# Patient Record
Sex: Female | Born: 1945 | Race: White | Hispanic: No | Marital: Married | Smoking: Never smoker
Health system: Southern US, Community
[De-identification: ages and names within clinical notes are randomized; demographics above are authoritative.]

## PROBLEM LIST (undated history)

## (undated) DIAGNOSIS — K219 Gastro-esophageal reflux disease without esophagitis: Secondary | ICD-10-CM

## (undated) DIAGNOSIS — M4800 Spinal stenosis, site unspecified: Secondary | ICD-10-CM

## (undated) DIAGNOSIS — R55 Syncope and collapse: Secondary | ICD-10-CM

## (undated) DIAGNOSIS — I10 Essential (primary) hypertension: Secondary | ICD-10-CM

## (undated) DIAGNOSIS — N200 Calculus of kidney: Secondary | ICD-10-CM

## (undated) DIAGNOSIS — E785 Hyperlipidemia, unspecified: Secondary | ICD-10-CM

## (undated) DIAGNOSIS — M549 Dorsalgia, unspecified: Secondary | ICD-10-CM

## (undated) DIAGNOSIS — Z87448 Personal history of other diseases of urinary system: Secondary | ICD-10-CM

## (undated) DIAGNOSIS — J45909 Unspecified asthma, uncomplicated: Secondary | ICD-10-CM

## (undated) DIAGNOSIS — M1990 Unspecified osteoarthritis, unspecified site: Secondary | ICD-10-CM

## (undated) DIAGNOSIS — Z86711 Personal history of pulmonary embolism: Secondary | ICD-10-CM

## (undated) DIAGNOSIS — G8929 Other chronic pain: Secondary | ICD-10-CM

## (undated) HISTORY — DX: Dorsalgia, unspecified: M54.9

## (undated) HISTORY — DX: Calculus of kidney: N20.0

## (undated) HISTORY — PX: STOMACH SURGERY: SHX791

## (undated) HISTORY — DX: Other chronic pain: G89.29

## (undated) HISTORY — DX: Spinal stenosis, site unspecified: M48.00

## (undated) HISTORY — DX: Syncope and collapse: R55

## (undated) HISTORY — DX: Essential (primary) hypertension: I10

## (undated) HISTORY — DX: Gastro-esophageal reflux disease without esophagitis: K21.9

## (undated) HISTORY — DX: Personal history of other diseases of urinary system: Z87.448

## (undated) HISTORY — DX: Unspecified osteoarthritis, unspecified site: M19.90

## (undated) HISTORY — DX: Unspecified asthma, uncomplicated: J45.909

## (undated) HISTORY — DX: Hyperlipidemia, unspecified: E78.5

## (undated) HISTORY — DX: Personal history of pulmonary embolism: Z86.711

---

## 1988-06-26 DIAGNOSIS — Z86711 Personal history of pulmonary embolism: Secondary | ICD-10-CM

## 1988-06-26 HISTORY — DX: Personal history of pulmonary embolism: Z86.711

## 2004-08-23 ENCOUNTER — Ambulatory Visit: Payer: Self-pay | Admitting: Psychiatry

## 2005-01-27 ENCOUNTER — Ambulatory Visit: Payer: Self-pay | Admitting: Gastroenterology

## 2005-02-14 ENCOUNTER — Ambulatory Visit: Payer: Self-pay | Admitting: Gastroenterology

## 2005-03-09 ENCOUNTER — Ambulatory Visit: Payer: Self-pay | Admitting: Internal Medicine

## 2005-04-06 ENCOUNTER — Emergency Department: Payer: Self-pay | Admitting: Emergency Medicine

## 2005-04-20 ENCOUNTER — Encounter: Payer: Self-pay | Admitting: General Practice

## 2005-07-12 ENCOUNTER — Ambulatory Visit: Payer: Self-pay | Admitting: Urology

## 2005-10-11 ENCOUNTER — Ambulatory Visit: Payer: Self-pay | Admitting: Urology

## 2005-10-30 ENCOUNTER — Ambulatory Visit: Payer: Self-pay | Admitting: Urology

## 2005-12-01 ENCOUNTER — Ambulatory Visit: Payer: Self-pay | Admitting: Internal Medicine

## 2006-01-19 ENCOUNTER — Ambulatory Visit: Payer: Self-pay | Admitting: Urology

## 2006-01-24 ENCOUNTER — Inpatient Hospital Stay: Payer: Self-pay | Admitting: Internal Medicine

## 2006-03-01 ENCOUNTER — Ambulatory Visit: Payer: Self-pay | Admitting: Gastroenterology

## 2006-03-01 ENCOUNTER — Other Ambulatory Visit: Payer: Self-pay

## 2006-03-08 ENCOUNTER — Ambulatory Visit: Payer: Self-pay | Admitting: Gastroenterology

## 2006-06-20 ENCOUNTER — Ambulatory Visit: Payer: Self-pay | Admitting: Urology

## 2006-12-14 ENCOUNTER — Ambulatory Visit: Payer: Self-pay | Admitting: Urology

## 2007-05-28 ENCOUNTER — Ambulatory Visit: Payer: Self-pay | Admitting: Urology

## 2007-09-20 ENCOUNTER — Ambulatory Visit: Payer: Self-pay | Admitting: Internal Medicine

## 2008-01-07 ENCOUNTER — Ambulatory Visit: Payer: Self-pay | Admitting: Urology

## 2008-02-28 ENCOUNTER — Ambulatory Visit: Payer: Self-pay | Admitting: Urology

## 2008-04-07 ENCOUNTER — Ambulatory Visit: Payer: Self-pay | Admitting: Urology

## 2008-11-11 ENCOUNTER — Ambulatory Visit: Payer: Self-pay | Admitting: Urology

## 2009-05-07 ENCOUNTER — Other Ambulatory Visit: Payer: Self-pay | Admitting: Internal Medicine

## 2009-06-08 ENCOUNTER — Ambulatory Visit: Payer: Self-pay | Admitting: Internal Medicine

## 2009-11-30 ENCOUNTER — Ambulatory Visit: Payer: Self-pay | Admitting: Cardiovascular Disease

## 2009-11-30 ENCOUNTER — Observation Stay: Payer: Self-pay | Admitting: Internal Medicine

## 2010-04-18 ENCOUNTER — Emergency Department: Payer: Self-pay | Admitting: Emergency Medicine

## 2010-06-03 ENCOUNTER — Other Ambulatory Visit: Payer: Self-pay | Admitting: Internal Medicine

## 2010-09-12 ENCOUNTER — Ambulatory Visit: Payer: Self-pay | Admitting: Gastroenterology

## 2011-01-06 ENCOUNTER — Other Ambulatory Visit: Payer: Self-pay | Admitting: Internal Medicine

## 2012-08-15 ENCOUNTER — Other Ambulatory Visit: Payer: Self-pay | Admitting: Internal Medicine

## 2012-08-15 LAB — URINALYSIS, COMPLETE
Bilirubin,UR: NEGATIVE
Glucose,UR: NEGATIVE mg/dL (ref 0–75)
Ketone: NEGATIVE
Nitrite: NEGATIVE
Ph: 5 (ref 4.5–8.0)
Protein: NEGATIVE
Specific Gravity: 1.016 (ref 1.003–1.030)
Squamous Epithelial: 4

## 2013-01-03 ENCOUNTER — Emergency Department: Payer: Self-pay | Admitting: Emergency Medicine

## 2013-02-06 ENCOUNTER — Ambulatory Visit: Payer: Self-pay | Admitting: Cardiovascular Disease

## 2013-02-26 ENCOUNTER — Encounter: Payer: Self-pay | Admitting: Cardiovascular Disease

## 2013-02-26 ENCOUNTER — Ambulatory Visit (INDEPENDENT_AMBULATORY_CARE_PROVIDER_SITE_OTHER): Payer: Medicare Other | Admitting: Cardiovascular Disease

## 2013-02-26 VITALS — BP 156/85 | HR 101 | Ht 67.0 in | Wt 189.0 lb

## 2013-02-26 DIAGNOSIS — R42 Dizziness and giddiness: Secondary | ICD-10-CM

## 2013-02-26 DIAGNOSIS — R5381 Other malaise: Secondary | ICD-10-CM

## 2013-02-26 DIAGNOSIS — R55 Syncope and collapse: Secondary | ICD-10-CM

## 2013-02-26 DIAGNOSIS — R002 Palpitations: Secondary | ICD-10-CM

## 2013-02-26 DIAGNOSIS — I10 Essential (primary) hypertension: Secondary | ICD-10-CM

## 2013-02-26 MED ORDER — METOPROLOL TARTRATE 25 MG PO TABS: 25.0000 mg | ORAL_TABLET | Freq: Two times a day (BID) | ORAL | Status: DC

## 2013-02-26 NOTE — Assessment & Plan Note (Signed)
She is tremendously deconditioned and relies on her husband who has underlying medical issues.

## 2013-02-26 NOTE — Assessment & Plan Note (Addendum)
Is clear she is having actual syncope episodes. Does not appear to have any traumatic events. She seems to have weak spells, "blacking out" of her vision requiring assistance from her husband. No orthostatic type numbers today. She is very deconditioned at baseline. Unable to exclude neurocardiogenic issues. Heart rate is elevated and we have ordered a Holter monitor for 48 hours . If her heart rate comes back elevated, we would hold her  HCTZ as she is not taking this on a consistent basis, and we would  start her on low-dose beta blocker for heart rate control, such as metoprolol tartrate 12.5 mg twice a day.  Prior echocardiograms normal. Symptoms are not consistent with angina.  If no events on 48 hour monitor, could consider 30 day monitor.

## 2013-02-26 NOTE — Patient Instructions (Addendum)
Hold the HCTZ We will have you wear a holter for 48 hours  Wait to hear from our office If heart rate is elevated, we will start metoprolol 1/2 pill twice a day  Please call us if you have new issues that need to be addressed before your next appt.  Your physician wants you to follow-up in: 1 month.

## 2013-02-26 NOTE — Progress Notes (Signed)
Patient ID: Kim [name], female    DOB: 09-04-1945, 67 y.o.   MRN: [id number]  HPI Comments: Kim [name] is a 67 year old woman with a history of recurrent syncope, admitted to the hospital June 2011 for similar symptoms. She has a history of gastric bypass, spinal stenosis, chronic pain, neuropathy of her feet, several years of dizziness and syncope with standing, long periods of time spent in bed in the past, who presents for further evaluation in the office.  She reports that she continues to have symptoms of blacking out when she stands up. Her vision goes dark. She lives in a trailer in does not have to walk very far. She does not walk around outside her trailer very much. She is dependent on her husband for almost everything. He has underlying medical issues as well.  Previous evaluation in the hospital recommended she wear compression hose which she reports as does not help. She was discharged on low-dose beta blockers for tachycardia and possible vasovagal episodes. She is no longer on her beta blocker. She was recently started on HCTZ.he did not have followup after her Hospital admission for event monitor. She reports having Holter monitor in the past several years ago with prior cardiologist. Details are not available  She is very deconditioned at baseline.  EKG today shows sinus tachycardia with rate 101 beats per minute, rare APCs  Prior echocardiogram June she does 11 was essentially normal CT scan of the spine July 2014 after a fall with neck injury. DJD noted  Recent lab work 02/11/2013 showing creatinine 1.31, potassium 3.4, normal hematocrit, Klebsiella pneumonia in her urine    Outpatient Encounter Prescriptions as of 02/26/2013  Medication Sig Dispense Refill  . allopurinol (ZYLOPRIM) 300 MG tablet Take 300 mg by mouth daily.      . diazepam (VALIUM) 5 MG tablet Take 5 mg by mouth 4 (four) times daily - after meals and at bedtime.      Marland Kitchen HYDROcodone-acetaminophen (NORCO)  10-325 MG per tablet Take 1 tablet by mouth as needed for pain.      . potassium chloride SA (K-DUR,KLOR-CON) 20 MEQ tablet Take 20 mEq by mouth daily.      . ranitidine (ZANTAC) 150 MG tablet Take 150 mg by mouth 4 (four) times daily - after meals and at bedtime.      . [DISCONTINUED] hydrochlorothiazide (HYDRODIURIL) 25 MG tablet Take 25 mg by mouth daily.      . metoprolol tartrate (LOPRESSOR) 25 MG tablet Take 1 tablet (25 mg total) by mouth 2 (two) times daily.  60 tablet  3   No facility-administered encounter medications on file as of 02/26/2013.     Review of Systems  HENT: Negative.   Eyes: Negative.   Respiratory: Negative.   Cardiovascular: Negative.   Gastrointestinal: Negative.   Musculoskeletal: Positive for gait problem.  Skin: Negative.   Neurological: Positive for dizziness, syncope and weakness.       "Black out spells"  Psychiatric/Behavioral: Negative.   All other systems reviewed and are negative.   BP 156/85  Pulse 101  Ht 5\' 7"  (1.702 m)  Wt 189 lb (85.73 kg)  BMI 29.59 kg/m2 Repeat blood pressures 138/78, 135/91, heart rate in the high 90s Physical Exam  Nursing note and vitals reviewed. Constitutional: She is oriented to person, place, and time. She appears well-developed and well-nourished.  HENT:  Head: Normocephalic.  Nose: Nose normal.  Mouth/Throat: Oropharynx is clear and moist.  Eyes: Conjunctivae are normal.  Pupils are equal, round, and reactive to light.  Neck: Normal range of motion. Neck supple. No JVD present.  Cardiovascular: Normal rate, regular rhythm, S1 normal, S2 normal, normal heart sounds and intact distal pulses.  Exam reveals no gallop and no friction rub.   No murmur heard. Pulmonary/Chest: Effort normal and breath sounds normal. No respiratory distress. She has no wheezes. She has no rales. She exhibits no tenderness.  Abdominal: Soft. Bowel sounds are normal. She exhibits no distension. There is no tenderness.   Musculoskeletal: Normal range of motion. She exhibits no edema and no tenderness.  Lymphadenopathy:    She has no cervical adenopathy.  Neurological: She is alert and oriented to person, place, and time. Coordination normal.  Skin: Skin is warm and dry. No rash noted. No erythema.  Psychiatric: She has a normal mood and affect. Her behavior is normal. Judgment and thought content normal.    Assessment and Plan

## 2013-02-26 NOTE — Assessment & Plan Note (Signed)
She's not taking her HCTZ. She is afraid of her low potassium. We will consider changing her to low-dose metoprolol tartrate given her tachycardia to be started after her Holter monitor

## 2013-02-28 ENCOUNTER — Telehealth: Payer: Self-pay | Admitting: *Deleted

## 2013-02-28 NOTE — Telephone Encounter (Signed)
Patient forgot to share some information. Patient fell and hit her head hard. She went to ED.

## 2013-02-28 NOTE — Telephone Encounter (Signed)
Spoke w/ pt.  She stated that she fell and hit her head in July and was diagnosed with a contusion, but not a concussion. Orthopedic doctor did x-ray today and found that her left arm is broken, as well, so she is now wearing a cast on both arms.  They also looked at her head x-ray and told her that she definitely had a concussion. She reports that she does not recall her dizziness being as bad before the fall and reports more falls since then. She wanted to let us know that she is terrified to get out of her bed and is not doing much while she is wearing her holter.   She is wondering now if her problem is her head and not her heart.

## 2013-03-17 ENCOUNTER — Telehealth: Payer: Self-pay

## 2013-03-17 ENCOUNTER — Encounter: Payer: Self-pay | Admitting: Cardiovascular Disease

## 2013-03-17 ENCOUNTER — Ambulatory Visit (INDEPENDENT_AMBULATORY_CARE_PROVIDER_SITE_OTHER): Payer: Medicare Other

## 2013-03-17 DIAGNOSIS — R55 Syncope and collapse: Secondary | ICD-10-CM

## 2013-03-17 NOTE — Telephone Encounter (Signed)
Spoke w/ pt.  She states that she experienced chest "discomfort" this weekend during the night, but it went away. Very lengthy conversation about spouse buying her metoprolol and not giving it to her, so she has not taken any of it.  She will start metoprolol 25 mg 1/2 pill bid for 1 week and call us back to let us know how she is doing.

## 2013-03-21 ENCOUNTER — Telehealth: Payer: Self-pay | Admitting: *Deleted

## 2013-03-21 NOTE — Telephone Encounter (Signed)
Pt was called with Holter results and added more symptoms. C/o low K+ and is now on kdur x 1 month C/o episodes of CP lasting 2 hrs, mid chest, pain 7/10, pain is sharp, caused HA, nausea, sweaty, she did not go to hospital. Pt had been laying in bed still/ awake when episode started. bp 178/103 p 78 151/93 p76 166/94 p 71  Please advise

## 2013-03-21 NOTE — Telephone Encounter (Signed)
Message copied by Antony Odea on Fri Mar 21, 2013  1:48 PM ------      Message from: Antonieta Iba      Created: Fri Mar 21, 2013  7:49 AM       Holter monitor looked good, only small number of extra beats      No cause of her black out or pass out spells ------

## 2013-03-31 ENCOUNTER — Encounter: Payer: Self-pay | Admitting: Cardiovascular Disease

## 2013-03-31 ENCOUNTER — Ambulatory Visit: Payer: Medicare Other | Admitting: Cardiovascular Disease

## 2013-03-31 ENCOUNTER — Ambulatory Visit (INDEPENDENT_AMBULATORY_CARE_PROVIDER_SITE_OTHER): Payer: Medicare Other | Admitting: Cardiovascular Disease

## 2013-03-31 VITALS — BP 174/100 | HR 102 | Ht 67.0 in | Wt 192.0 lb

## 2013-03-31 DIAGNOSIS — R5381 Other malaise: Secondary | ICD-10-CM

## 2013-03-31 DIAGNOSIS — R0602 Shortness of breath: Secondary | ICD-10-CM

## 2013-03-31 DIAGNOSIS — R55 Syncope and collapse: Secondary | ICD-10-CM

## 2013-03-31 DIAGNOSIS — R079 Chest pain, unspecified: Secondary | ICD-10-CM

## 2013-03-31 DIAGNOSIS — R42 Dizziness and giddiness: Secondary | ICD-10-CM

## 2013-03-31 DIAGNOSIS — I10 Essential (primary) hypertension: Secondary | ICD-10-CM

## 2013-03-31 MED ORDER — VERAPAMIL HCL ER 120 MG PO TBCR: 120.0000 mg | EXTENDED_RELEASE_TABLET | Freq: Every day | ORAL | Status: AC

## 2013-03-31 NOTE — Progress Notes (Signed)
Patient ID: Kim [name], female    DOB: 03/07/1946, 67 y.o.   MRN: [id number]  HPI Comments: [name] is a 67 year old woman with a history of recurrent syncope, admitted to the hospital June 2011 for similar symptoms, history of gastric bypass, spinal stenosis, chronic pain, neuropathy of her feet, several years of dizziness and syncope with standing, long periods of time spent in bed in the past, who presents for further evaluation in the office. She has been dependent on her husband for much of her care at home. She has multiple orthopedic complaints.  She reports today having significant stress at home. She and her husband aren't not getting along well. He is not helping her with her ADLs. She continues to have periods of tachycardia, pulse reports having severe hypertension when she checks her blood pressure at home. She does not walk around outside her trailer very much. She is dependent on her husband for almost everything. She is very deconditioned at baseline.  EKG today shows sinus tachycardia with rate 102 beats per minute.  Prior echocardiogram June she does 11 was essentially normal CT scan of the spine July 2014 after a fall with neck injury. DJD noted  Recent lab work 02/11/2013 showing creatinine 1.31, potassium 3.4, normal hematocrit, Klebsiella pneumonia in her urine    Outpatient Encounter Prescriptions as of 03/31/2013  Medication Sig Dispense Refill  . allopurinol (ZYLOPRIM) 300 MG tablet Take 300 mg by mouth daily.      . diazepam (VALIUM) 5 MG tablet Take 5 mg by mouth every 6 (six) hours as needed.       Marland Kitchen HYDROcodone-acetaminophen (NORCO) 10-325 MG per tablet Take 1 tablet by mouth every 6 (six) hours as needed for pain.       . potassium chloride SA (K-DUR,KLOR-CON) 20 MEQ tablet Take 20 mEq by mouth daily.      . ranitidine (ZANTAC) 150 MG tablet Take 150 mg by mouth 2 (two) times daily.       . metoprolol tartrate (LOPRESSOR) 25 MG tablet Take 1 tablet (25 mg  total) by mouth 2 (two) times daily.  60 tablet  3  . [DISCONTINUED] allopurinol (ZYLOPRIM) 300 MG tablet Take 300 mg by mouth daily.       No facility-administered encounter medications on file as of 03/31/2013.     Review of Systems  HENT: Negative.   Eyes: Negative.   Respiratory: Negative.   Cardiovascular: Positive for palpitations.  Gastrointestinal: Negative.   Musculoskeletal: Positive for gait problem.  Skin: Negative.   Neurological: Positive for dizziness.  Psychiatric/Behavioral: Negative.   All other systems reviewed and are negative.   BP 174/100  Pulse 102  Ht 5\' 7"  (1.702 m)  Wt 192 lb (87.091 kg)  BMI 30.06 kg/m2  Physical Exam  Nursing note and vitals reviewed. Constitutional: She is oriented to person, place, and time. She appears well-developed and well-nourished.  HENT:  Head: Normocephalic.  Nose: Nose normal.  Mouth/Throat: Oropharynx is clear and moist.  Eyes: Conjunctivae are normal. Pupils are equal, round, and reactive to light.  Neck: Normal range of motion. Neck supple. No JVD present.  Cardiovascular: Normal rate, regular rhythm, S1 normal, S2 normal, normal heart sounds and intact distal pulses.  Exam reveals no gallop and no friction rub.   No murmur heard. Pulmonary/Chest: Effort normal and breath sounds normal. No respiratory distress. She has no wheezes. She has no rales. She exhibits no tenderness.  Abdominal: Soft. Bowel sounds are normal.  She exhibits no distension. There is no tenderness.  Musculoskeletal: Normal range of motion. She exhibits no edema and no tenderness.  Lymphadenopathy:    She has no cervical adenopathy.  Neurological: She is alert and oriented to person, place, and time. Coordination normal.  Skin: Skin is warm and dry. No rash noted. No erythema.  Psychiatric: She has a normal mood and affect. Her behavior is normal. Judgment and thought content normal.    Assessment and Plan

## 2013-03-31 NOTE — Assessment & Plan Note (Signed)
Significant hypertension today. We will restart her on verapamil 120 mg daily as she reports being on this medication in the past. Hopefully this will also help her palpitations. We have asked her to closely monitor her blood pressure

## 2013-03-31 NOTE — Assessment & Plan Note (Signed)
Encouraged her to start a walking program. She appears to be moving around the clinic exam room relatively well

## 2013-03-31 NOTE — Patient Instructions (Addendum)
You are doing well. Please start verapamil 120 mg daily  Please call us if you have new issues that need to be addressed before your next appt.  Your physician wants you to follow-up in: 6 months.  You will receive a reminder letter in the mail two months in advance. If you don't receive a letter, please call our office to schedule the follow-up appointment.

## 2013-03-31 NOTE — Assessment & Plan Note (Signed)
No recent episodes of syncope. Etiology of these episodes is unclear.

## 2013-03-31 NOTE — Assessment & Plan Note (Signed)
One episode of dizziness his past weekend in the setting of severe hypertension. We'll work on her blood pressure

## 2013-04-03 ENCOUNTER — Telehealth: Payer: Self-pay | Admitting: *Deleted

## 2013-04-03 NOTE — Telephone Encounter (Signed)
Please call patient still having chest pain and has medication questions. Thanks!

## 2013-04-08 ENCOUNTER — Telehealth: Payer: Self-pay | Admitting: *Deleted

## 2013-04-08 NOTE — Telephone Encounter (Signed)
Patient still having chest pains please advise

## 2013-05-07 ENCOUNTER — Ambulatory Visit: Payer: Self-pay | Admitting: Internal Medicine

## 2013-05-15 ENCOUNTER — Ambulatory Visit: Payer: Self-pay | Admitting: Internal Medicine

## 2013-05-26 ENCOUNTER — Ambulatory Visit: Payer: Self-pay | Admitting: Gastroenterology

## 2013-07-14 ENCOUNTER — Other Ambulatory Visit: Payer: Self-pay

## 2013-07-14 MED ORDER — METOPROLOL TARTRATE 25 MG PO TABS: 25.0000 mg | ORAL_TABLET | Freq: Two times a day (BID) | ORAL | Status: AC

## 2013-07-14 NOTE — Telephone Encounter (Signed)
Refill sent for metoprolol tart 25 mg twice a day

## 2013-08-05 ENCOUNTER — Ambulatory Visit: Payer: Medicare Other | Admitting: Cardiovascular Disease

## 2013-10-01 ENCOUNTER — Ambulatory Visit: Payer: Self-pay | Admitting: Gastroenterology

## 2013-10-07 ENCOUNTER — Emergency Department: Payer: Self-pay | Admitting: Emergency Medicine

## 2013-10-07 LAB — COMPREHENSIVE METABOLIC PANEL
ALBUMIN: 3.4 g/dL (ref 3.4–5.0)
ALK PHOS: 97 U/L
ALT: 13 U/L (ref 12–78)
Anion Gap: 4 — ABNORMAL LOW (ref 7–16)
BILIRUBIN TOTAL: 0.5 mg/dL (ref 0.2–1.0)
BUN: 11 mg/dL (ref 7–18)
CHLORIDE: 99 mmol/L (ref 98–107)
CO2: 34 mmol/L — AB (ref 21–32)
Calcium, Total: 8.9 mg/dL (ref 8.5–10.1)
Creatinine: 1.4 mg/dL — ABNORMAL HIGH (ref 0.60–1.30)
EGFR (Non-African Amer.): 39 — ABNORMAL LOW
GFR CALC AF AMER: 46 — AB
Glucose: 105 mg/dL — ABNORMAL HIGH (ref 65–99)
Osmolality: 274 (ref 275–301)
Potassium: 3.4 mmol/L — ABNORMAL LOW (ref 3.5–5.1)
SGOT(AST): 14 U/L — ABNORMAL LOW (ref 15–37)
Sodium: 137 mmol/L (ref 136–145)
Total Protein: 7.2 g/dL (ref 6.4–8.2)

## 2013-10-07 LAB — CBC
HCT: 38.5 % (ref 35.0–47.0)
HGB: 11.9 g/dL — ABNORMAL LOW (ref 12.0–16.0)
MCH: 29.3 pg (ref 26.0–34.0)
MCHC: 31 g/dL — AB (ref 32.0–36.0)
MCV: 95 fL (ref 80–100)
Platelet: 246 10*3/uL (ref 150–440)
RBC: 4.06 10*6/uL (ref 3.80–5.20)
RDW: 14.4 % (ref 11.5–14.5)
WBC: 7.9 10*3/uL (ref 3.6–11.0)

## 2013-10-11 ENCOUNTER — Emergency Department: Payer: Self-pay | Admitting: Emergency Medicine

## 2013-10-15 ENCOUNTER — Ambulatory Visit: Payer: Self-pay | Admitting: Orthopedic Surgery

## 2013-10-16 ENCOUNTER — Ambulatory Visit: Payer: Self-pay | Admitting: Orthopedic Surgery

## 2013-10-17 ENCOUNTER — Encounter: Payer: Self-pay | Admitting: Internal Medicine

## 2013-10-22 ENCOUNTER — Ambulatory Visit: Payer: Medicare Other | Admitting: Cardiovascular Disease

## 2013-10-24 ENCOUNTER — Encounter: Payer: Self-pay | Admitting: Internal Medicine

## 2013-10-24 LAB — BASIC METABOLIC PANEL
ANION GAP: 5 — AB (ref 7–16)
BUN: 11 mg/dL (ref 7–18)
CALCIUM: 9 mg/dL (ref 8.5–10.1)
Chloride: 104 mmol/L (ref 98–107)
Co2: 31 mmol/L (ref 21–32)
Creatinine: 1.11 mg/dL (ref 0.60–1.30)
EGFR (African American): >60
GFR CALC NON AF AMER: 52 — AB
GLUCOSE: 101 mg/dL — AB (ref 65–99)
OSMOLALITY: 279 (ref 275–301)
Potassium: 3.7 mmol/L (ref 3.5–5.1)
Sodium: 140 mmol/L (ref 136–145)

## 2013-10-24 LAB — CBC WITH DIFFERENTIAL/PLATELET
BASOS ABS: 0 10*3/uL (ref 0.0–0.1)
Basophil %: 0.7 %
Eosinophil #: 0.2 10*3/uL (ref 0.0–0.7)
Eosinophil %: 4 %
HCT: 35.9 % (ref 35.0–47.0)
HGB: 11.9 g/dL — ABNORMAL LOW (ref 12.0–16.0)
LYMPHS PCT: 42.4 %
Lymphocyte #: 2.6 10*3/uL (ref 1.0–3.6)
MCH: 31 pg (ref 26.0–34.0)
MCHC: 33.1 g/dL (ref 32.0–36.0)
MCV: 94 fL (ref 80–100)
MONO ABS: 0.4 x10 3/mm (ref 0.2–0.9)
MONOS PCT: 7 %
Neutrophil #: 2.8 10*3/uL (ref 1.4–6.5)
Neutrophil %: 45.9 %
PLATELETS: 408 10*3/uL (ref 150–440)
RBC: 3.82 10*6/uL (ref 3.80–5.20)
RDW: 13.9 % (ref 11.5–14.5)
WBC: 6.2 10*3/uL (ref 3.6–11.0)

## 2013-10-24 LAB — TSH: Thyroid Stimulating Horm: 0.546 u[IU]/mL

## 2013-10-24 LAB — MAGNESIUM: Magnesium: 2 mg/dL

## 2013-11-02 LAB — TROPONIN I: Troponin-I: <0.02 ng/mL

## 2013-11-02 LAB — COMPREHENSIVE METABOLIC PANEL
ALT: 13 U/L (ref 12–78)
Albumin: 3.6 g/dL (ref 3.4–5.0)
Alkaline Phosphatase: 117 U/L
Anion Gap: 8 (ref 7–16)
BUN: 13 mg/dL (ref 7–18)
Bilirubin,Total: 0.5 mg/dL (ref 0.2–1.0)
CALCIUM: 8.8 mg/dL (ref 8.5–10.1)
CO2: 28 mmol/L (ref 21–32)
Chloride: 101 mmol/L (ref 98–107)
Creatinine: 1.53 mg/dL — ABNORMAL HIGH (ref 0.60–1.30)
EGFR (African American): 41 — ABNORMAL LOW
EGFR (Non-African Amer.): 35 — ABNORMAL LOW
GLUCOSE: 94 mg/dL (ref 65–99)
Osmolality: 274 (ref 275–301)
Potassium: 3.5 mmol/L (ref 3.5–5.1)
SGOT(AST): 42 U/L — ABNORMAL HIGH (ref 15–37)
Sodium: 137 mmol/L (ref 136–145)
Total Protein: 7.7 g/dL (ref 6.4–8.2)

## 2013-11-02 LAB — PROTIME-INR
INR: 1
PROTHROMBIN TIME: 12.7 s (ref 11.5–14.7)

## 2013-11-02 LAB — CBC
HCT: 38.1 % (ref 35.0–47.0)
HGB: 12.3 g/dL (ref 12.0–16.0)
MCH: 30.5 pg (ref 26.0–34.0)
MCHC: 32.4 g/dL (ref 32.0–36.0)
MCV: 94 fL (ref 80–100)
PLATELETS: 302 10*3/uL (ref 150–440)
RBC: 4.05 10*6/uL (ref 3.80–5.20)
RDW: 13.7 % (ref 11.5–14.5)
WBC: 9 10*3/uL (ref 3.6–11.0)

## 2013-11-03 ENCOUNTER — Inpatient Hospital Stay: Payer: Self-pay | Admitting: Internal Medicine

## 2013-11-03 LAB — DRUG SCREEN, URINE
Amphetamines, Ur Screen: NEGATIVE (ref ?–1000)
BENZODIAZEPINE, UR SCRN: POSITIVE (ref ?–200)
Barbiturates, Ur Screen: NEGATIVE (ref ?–200)
Cannabinoid 50 Ng, Ur ~~LOC~~: NEGATIVE (ref ?–50)
Cocaine Metabolite,Ur ~~LOC~~: NEGATIVE (ref ?–300)
MDMA (ECSTASY) UR SCREEN: NEGATIVE (ref ?–500)
METHADONE, UR SCREEN: NEGATIVE (ref ?–300)
OPIATE, UR SCREEN: POSITIVE (ref ?–300)
PHENCYCLIDINE (PCP) UR S: NEGATIVE (ref ?–25)
TRICYCLIC, UR SCREEN: NEGATIVE (ref ?–1000)

## 2013-11-03 LAB — URINALYSIS, COMPLETE
Bilirubin,UR: NEGATIVE
Glucose,UR: NEGATIVE mg/dL (ref 0–75)
Ketone: NEGATIVE
NITRITE: NEGATIVE
PROTEIN: NEGATIVE
Ph: 6 (ref 4.5–8.0)
SPECIFIC GRAVITY: 1.004 (ref 1.003–1.030)
WBC UR: 265 /HPF (ref 0–5)

## 2013-11-04 LAB — CBC WITH DIFFERENTIAL/PLATELET
BASOS ABS: 0 10*3/uL (ref 0.0–0.1)
Basophil %: 0.7 %
EOS PCT: 6.1 %
Eosinophil #: 0.4 10*3/uL (ref 0.0–0.7)
HCT: 36.4 % (ref 35.0–47.0)
HGB: 11.8 g/dL — ABNORMAL LOW (ref 12.0–16.0)
Lymphocyte #: 2.5 10*3/uL (ref 1.0–3.6)
Lymphocyte %: 38.7 %
MCH: 30.6 pg (ref 26.0–34.0)
MCHC: 32.5 g/dL (ref 32.0–36.0)
MCV: 94 fL (ref 80–100)
MONOS PCT: 7.6 %
Monocyte #: 0.5 x10 3/mm (ref 0.2–0.9)
Neutrophil #: 3 10*3/uL (ref 1.4–6.5)
Neutrophil %: 46.9 %
PLATELETS: 248 10*3/uL (ref 150–440)
RBC: 3.87 10*6/uL (ref 3.80–5.20)
RDW: 14 % (ref 11.5–14.5)
WBC: 6.5 10*3/uL (ref 3.6–11.0)

## 2013-11-04 LAB — BASIC METABOLIC PANEL
Anion Gap: 5 — ABNORMAL LOW (ref 7–16)
BUN: 10 mg/dL (ref 7–18)
CO2: 29 mmol/L (ref 21–32)
Calcium, Total: 8.8 mg/dL (ref 8.5–10.1)
Chloride: 109 mmol/L — ABNORMAL HIGH (ref 98–107)
Creatinine: 1.33 mg/dL — ABNORMAL HIGH (ref 0.60–1.30)
EGFR (African American): 48 — ABNORMAL LOW
EGFR (Non-African Amer.): 42 — ABNORMAL LOW
Glucose: 104 mg/dL — ABNORMAL HIGH (ref 65–99)
Osmolality: 284 (ref 275–301)
POTASSIUM: 3.6 mmol/L (ref 3.5–5.1)
SODIUM: 143 mmol/L (ref 136–145)

## 2013-11-05 LAB — URINE CULTURE

## 2013-11-06 LAB — AMMONIA: Ammonia, Plasma: 14 mcmol/L (ref 11–32)

## 2013-11-07 LAB — VALPROIC ACID LEVEL: Valproic Acid: 78 ug/mL

## 2013-11-08 LAB — CREATININE, SERUM
CREATININE: 1.6 mg/dL — AB (ref 0.60–1.30)
EGFR (African American): 39 — ABNORMAL LOW
EGFR (Non-African Amer.): 33 — ABNORMAL LOW

## 2013-11-09 LAB — CREATININE, SERUM
Creatinine: 1.69 mg/dL — ABNORMAL HIGH (ref 0.60–1.30)
EGFR (African American): 36 — ABNORMAL LOW
EGFR (Non-African Amer.): 31 — ABNORMAL LOW

## 2013-11-10 LAB — CREATININE, SERUM
Creatinine: 1.5 mg/dL — ABNORMAL HIGH (ref 0.60–1.30)
EGFR (African American): 42 — ABNORMAL LOW
EGFR (Non-African Amer.): 36 — ABNORMAL LOW

## 2013-12-02 ENCOUNTER — Emergency Department: Payer: Self-pay | Admitting: Emergency Medicine

## 2013-12-02 LAB — DRUG SCREEN, URINE
AMPHETAMINES, UR SCREEN: NEGATIVE (ref ?–1000)
Barbiturates, Ur Screen: NEGATIVE (ref ?–200)
Benzodiazepine, Ur Scrn: POSITIVE (ref ?–200)
Cannabinoid 50 Ng, Ur ~~LOC~~: NEGATIVE (ref ?–50)
Cocaine Metabolite,Ur ~~LOC~~: NEGATIVE (ref ?–300)
MDMA (Ecstasy)Ur Screen: NEGATIVE (ref ?–500)
Methadone, Ur Screen: NEGATIVE (ref ?–300)
OPIATE, UR SCREEN: POSITIVE (ref ?–300)
Phencyclidine (PCP) Ur S: NEGATIVE (ref ?–25)
Tricyclic, Ur Screen: POSITIVE (ref ?–1000)

## 2013-12-02 LAB — COMPREHENSIVE METABOLIC PANEL
ALBUMIN: 2.7 g/dL — AB (ref 3.4–5.0)
ALT: 11 U/L — AB (ref 12–78)
Alkaline Phosphatase: 149 U/L — ABNORMAL HIGH
Anion Gap: 7 (ref 7–16)
BILIRUBIN TOTAL: 0.5 mg/dL (ref 0.2–1.0)
BUN: 19 mg/dL — ABNORMAL HIGH (ref 7–18)
CO2: 27 mmol/L (ref 21–32)
CREATININE: 1.44 mg/dL — AB (ref 0.60–1.30)
Calcium, Total: 8.5 mg/dL (ref 8.5–10.1)
Chloride: 107 mmol/L (ref 98–107)
EGFR (Non-African Amer.): 38 — ABNORMAL LOW
GFR CALC AF AMER: 44 — AB
Glucose: 98 mg/dL (ref 65–99)
Osmolality: 283 (ref 275–301)
Potassium: 3.8 mmol/L (ref 3.5–5.1)
SGOT(AST): 21 U/L (ref 15–37)
Sodium: 141 mmol/L (ref 136–145)
Total Protein: 6.3 g/dL — ABNORMAL LOW (ref 6.4–8.2)

## 2013-12-02 LAB — URINALYSIS, COMPLETE
BILIRUBIN, UR: NEGATIVE
BLOOD: NEGATIVE
Bacteria: NONE SEEN
Glucose,UR: NEGATIVE mg/dL (ref 0–75)
Hyaline Cast: 7
Nitrite: NEGATIVE
PROTEIN: NEGATIVE
Ph: 5 (ref 4.5–8.0)
RBC,UR: 12 /HPF (ref 0–5)
SPECIFIC GRAVITY: 1.02 (ref 1.003–1.030)
Squamous Epithelial: 11
WBC UR: 22 /HPF (ref 0–5)

## 2013-12-02 LAB — CBC
HCT: 30.5 % — ABNORMAL LOW (ref 35.0–47.0)
HGB: 9.8 g/dL — AB (ref 12.0–16.0)
MCH: 30.3 pg (ref 26.0–34.0)
MCHC: 32.1 g/dL (ref 32.0–36.0)
MCV: 94 fL (ref 80–100)
Platelet: 225 10*3/uL (ref 150–440)
RBC: 3.23 10*6/uL — ABNORMAL LOW (ref 3.80–5.20)
RDW: 13.7 % (ref 11.5–14.5)
WBC: 6.4 10*3/uL (ref 3.6–11.0)

## 2013-12-02 LAB — TSH: THYROID STIMULATING HORM: 0.825 u[IU]/mL

## 2013-12-02 LAB — ETHANOL
Ethanol %: <0.003 % (ref 0.000–0.080)
Ethanol: <3 mg/dL

## 2013-12-02 LAB — SALICYLATE LEVEL: Salicylates, Serum: <1.7 mg/dL

## 2013-12-02 LAB — VALPROIC ACID LEVEL

## 2013-12-02 LAB — ACETAMINOPHEN LEVEL: Acetaminophen: 4 ug/mL — ABNORMAL LOW

## 2013-12-03 LAB — URINE CULTURE

## 2013-12-09 LAB — CARBAMAZEPINE LEVEL, TOTAL: CARBAMAZEPINE: 11.1 ug/mL (ref 4.0–12.0)

## 2013-12-14 ENCOUNTER — Other Ambulatory Visit: Payer: Self-pay | Admitting: Family Medicine

## 2013-12-14 LAB — COMPREHENSIVE METABOLIC PANEL
AST: 33 U/L (ref 15–37)
Albumin: 2.8 g/dL — ABNORMAL LOW (ref 3.4–5.0)
Alkaline Phosphatase: 162 U/L — ABNORMAL HIGH
Anion Gap: 8 (ref 7–16)
BUN: 15 mg/dL (ref 7–18)
Bilirubin,Total: 0.3 mg/dL (ref 0.2–1.0)
CHLORIDE: 104 mmol/L (ref 98–107)
CO2: 30 mmol/L (ref 21–32)
Calcium, Total: 8.7 mg/dL (ref 8.5–10.1)
Creatinine: 1.36 mg/dL — ABNORMAL HIGH (ref 0.60–1.30)
GFR CALC AF AMER: 47 — AB
GFR CALC NON AF AMER: 41 — AB
GLUCOSE: 98 mg/dL (ref 65–99)
OSMOLALITY: 284 (ref 275–301)
Potassium: 3.3 mmol/L — ABNORMAL LOW (ref 3.5–5.1)
SGPT (ALT): 16 U/L (ref 12–78)
Sodium: 142 mmol/L (ref 136–145)
TOTAL PROTEIN: 5.9 g/dL — AB (ref 6.4–8.2)

## 2013-12-14 LAB — CBC WITH DIFFERENTIAL/PLATELET
BASOS ABS: 0.1 10*3/uL (ref 0.0–0.1)
BASOS PCT: 1.7 %
Eosinophil #: 0.6 10*3/uL (ref 0.0–0.7)
Eosinophil %: 9.3 %
HCT: 28.4 % — AB (ref 35.0–47.0)
HGB: 9.5 g/dL — ABNORMAL LOW (ref 12.0–16.0)
Lymphocyte #: 2.5 10*3/uL (ref 1.0–3.6)
Lymphocyte %: 42.5 %
MCH: 31.8 pg (ref 26.0–34.0)
MCHC: 33.5 g/dL (ref 32.0–36.0)
MCV: 95 fL (ref 80–100)
Monocyte #: 0.6 x10 3/mm (ref 0.2–0.9)
Monocyte %: 9.6 %
Neutrophil #: 2.2 10*3/uL (ref 1.4–6.5)
Neutrophil %: 36.9 %
Platelet: 282 10*3/uL (ref 150–440)
RBC: 2.99 10*6/uL — AB (ref 3.80–5.20)
RDW: 14.3 % (ref 11.5–14.5)
WBC: 6 10*3/uL (ref 3.6–11.0)

## 2014-10-17 NOTE — Discharge Summary (Signed)
PATIENT NAME:  Kim AlfSCHOOLS, Kim [name] MR#:  [id number] DATE OF BIRTH:  05/23/1946  DATE OF ADMISSION:  10/16/2013 DATE OF DISCHARGE:  10/17/2013  ADMITTING DIAGNOSIS: Comminuted displaced right distal radius fracture.   DISCHARGE DIAGNOSIS: Comminuted displaced right distal radius fracture.   PROCEDURE: ORIF right distal radius.   ANESTHESIA: General.   SURGEON: Leitha SchullerMichael J. Menz, MD.   COMPLICATIONS: There were no complications.   ESTIMATED BLOOD LOSS: Minimal.   IMPLANTS: [employer] short standard DVR plate with multiple screws and pegs.   HISTORY: The patient is a 69 year old female who is right hand dominant who fell on Saturday, 10/11/2013, and suffered a severely comminuted displaced right distal radius fracture. She had closed reduction in the ER with improved alignment, but ultimately she needed to be set up for right distal radius ORIF. She lives at home and needs some assistance with her husband. She is right hand dominant. The patient's pain has been moderate to severe. She has been taking Norco 5/325 one to two tablets every 4 to 6 hours as needed for pain. She is allergic to oxycodone. She denies any numbness or tingling in the right upper extremity. Swelling is improved since date of injury, on the 18th. She denies any elbow or shoulder discomfort.   PHYSICAL EXAMINATION: GENERAL: Well-developed, well-nourished female in no apparent distress. Normal affect. Presents in a wheelchair.  HEENT: Head normocephalic, atraumatic. Pupils equal, round, and reactive to light. She wears a soft collar around the cervical C-spine for degenerative disk disease.  HEART: Regular rate and rhythm.  LUNGS: Clear to auscultation bilaterally. No wheezing, rales or rhonchi.  RIGHT WRIST: The patient is in a sugar tong splint. The patient's fingers are moderately swollen with mild ecchymosis. She appears to have no skin breakdown noted. She has 2+ cap refill. Splint was partially removed and she  had 2+ radial pulse. Sensation was intact.   HOSPITAL COURSE: The patient was admitted to the hospital on October 16, 2013. She had surgery that same day and was brought to the orthopedic floor from the PACU in stable condition. On postop day 1, the patient was evaluated by physical therapy and it was found that she was having difficulty with activities of daily living and it would be best if she was able to be discharged to a rehab facility. The patient's vital signs were stable at discharge. Pain was controlled.  DISCHARGE INSTRUCTIONS: The patient should elevate the effected hand or arm on 1 or 2 pillows with the hand higher than the elbow. She may resume a regular diet as tolerated. Apply an ice pack to the effected area. Do not get the dressing or bandage wet or dirty. [facility] if the dressing gets water under it. Leave the dressing on. [facility] if any of the following occur: Bright red bleeding from the incision, fever above 101.5 degrees, redness, swelling, or drainage at the incision. [facility] if you experience any increased leg pain, numbness or weakness in your legs or bowel or bladder symptoms. The patient needs to follow-[facility] with [facility] in 7 to 10 days.   DISCHARGE PHYSICIAN: Dr. [facility]Menz at [facility].   DISCHARGE MEDICATIONS: Please see discharge instructions for discharge medications.   ____________________________ T. Cranston Neighborhris Gaines, PA-C tcg:sb D: 10/17/2013 08:14:21 ET T: 10/17/2013 08:26:44 ET JOB#: [id number]  cc: T. Cranston Neighborhris Gaines, PA-C, <Dictator> Evon SlackHOMAS C GAINES GeorgiaPA ELECTRONICALLY SIGNED 10/17/2013 15:13

## 2014-10-17 NOTE — Discharge Summary (Signed)
Dates of Admission and Diagnosis:  Date of Admission 03-Nov-2013   Date of Discharge 07-Nov-2013   Admitting Diagnosis confusion, UTI   Final Diagnosis 1. Enterococcus uti 2. Dementia 3. Psychosis    Chief Complaint/History of Present Illness CHIEF COMPLAINT: Altered mental status, agitation and anxiety.   HISTORY OF PRESENT ILLNESS: The patient is a 69 year old white female with progressive dementia who was currently in a rehab facility after she was discharged on the 24th after she had surgery to her wrist due to a right distal radius fracture. Prior to that, she was living with her husband, however, she had issues with staying up at night and had problems with agitation. However, the husband did not tell her his daughter but finally he told them when she had the fall. She was sent to the rehab facility where she has been agitated and has been confused. She was brought to the ED. Initially, they felt that this was a psychiatric-related issue going on. Finally, the ED physician that saw her today checked her urinalysis and urinalysis did show 3+ leukocytes, WBCs are 265, so there is concern that some of her symptoms may be related to a urinary tract infection. The patient, at the time of my interview, is tangential and is confused. She is not sure where is, she does not recall having surgery to her wrist. She is unable to give me any history. Her daughter, who works at the hospital, was able to obtain some history from her. Otherwise review of systems unobtainable.   Allergies:  Stadol: Alt Ment Status  Macrodantin: Hives  Other -Explain in Comment Field: Hives  Codeine: Other  Latex: Unknown  corisone,  patient request: Unknown  Zofran: Unknown  Buprenex: Unknown  Talwin: Unknown  TDMs:  15-May-15 06:04   Valproic Acid, Serum 78 (50-100 POTENTIALLY TOXIC:  > 200 mcg/mL)  Routine Chem:  12-May-15 06:34   Glucose, Serum  104  BUN 10  Creatinine (comp)  1.33  Sodium, Serum 143   Potassium, Serum 3.6  Chloride, Serum  109  CO2, Serum 29  Calcium (Total), Serum 8.8  Anion Gap  5  Osmolality (calc) 284  eGFR (African American)  48  eGFR (Non-African American)  42 (eGFR values <60mL/min/1.73 m2 may be an indication of chronic kidney disease (CKD). Calculated eGFR is useful in patients with stable renal function. The eGFR calculation will not be reliable in acutely ill patients when serum creatinine is changing rapidly. It is not useful in  patients on dialysis. The eGFR calculation may not be applicable to patients at the low and high extremes of body sizes, pregnant women, and vegetarians.)  14-May-15 15:04   Ammonia, Plasma 14 (Result(s) reported on 06 Nov 2013 at 03:45PM.)  Routine Hem:  12-May-15 06:34   WBC (CBC) 6.5  RBC (CBC) 3.87  Hemoglobin (CBC)  11.8  Hematocrit (CBC) 36.4  Platelet Count (CBC) 248  MCV 94  MCH 30.6  MCHC 32.5  RDW 14.0  Neutrophil % 46.9  Lymphocyte % 38.7  Monocyte % 7.6  Eosinophil % 6.1  Basophil % 0.7  Neutrophil # 3.0  Lymphocyte # 2.5  Monocyte # 0.5  Eosinophil # 0.4  Basophil # 0.0 (Result(s) reported on 04 Nov 2013 at 06:59AM.)   Pertinent Past History:  Pertinent Past History PAST MEDICAL HISTORY: 1.  History of neurogenic bladder.  2.  Degenerative joint disease.  3.  History of asthma.  4.  History of pulmonary embolism.  5.  Low back  pain and neck pain.  6.  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  7.  Hypertension.   PAST SURGICAL HISTORY: Status post cholecystectomy, gastric surgery, bladder surgery, hysterectomy, right knee surgery, left shoulder surgery, right shoulder surgery and recent wrist surgery of the right.   Hospital Course:  Hospital Course pt is 69 y.o sent from [facility] for confusion and agiation. Has had progressively worsening dementia/confusion per family.  * Dementia. with paranoia and delusional On Zyprexa, Depakote, Thorazine, Valium. Doing better.  *  Acute encephalopathy over baseline confusion  * Enterococcus UTI Levaquin for 3 more days  * HTN Metoprolol Added lisinopril  * Chronic left shoulder pain-Xray showed no dislocation or fracture Also haad recent right wrist surgery with pain.  TOday on exam  Oral thrush S1, S2 Lungs CTA Tangential, delusional thoughts  Time soent on d/c 40 min   Condition on Discharge Fair   Code Status:  Code Status No Code/Do Not Resuscitate   DISCHARGE INSTRUCTIONS HOME MEDS:  Medication Reconciliation: Patient's Home Medications at Discharge:     Medication Instructions  metoprolol tartrate 25 mg oral tablet  1 tab(s) orally 2 times a day   tapentadol 75 mg oral tablet  1 tab(s) orally every 4 to 6 hours as needed for pain   ranitidine 150 mg oral tablet  1 tab(s) orally 2 times a day   psyllium 3.4 g/7 g oral powder for reconstitution  1 packet(s) in water or beverage and drink orally once a day   ensure liquid  1 can orally 2 times a day between meals for weight loss   lactulose 10 g/15 ml oral syrup  30 milliliters (20g) orally once a day, As Needed - for Constipation   promethazine 25 mg oral tablet  1 tab(s) orally every 4 hours, As Needed - for Nausea, Vomiting. *may use suppository rectal administration, injectable for for deep im injections*   fleet enema 7 g-19 g rectal enema  1 bottle (133 milliliters) rectal every 3 days as needed for constipation if not relieved by MOM and bisacodyl suppository*   acetaminophen 325 mg oral tablet  2 tab(s) orally every 4 hours, As needed, pain or temp. greater than 100.4   acetaminophen-hydrocodone 325 mg-10 mg oral tablet  1 tab(s) orally every 6 hours, As Needed, pain as needed for pain   olanzapine 5 mg oral tablet  1 tab(s) orally 2 times a day   diazepam 5 mg oral tablet  1 tab(s) orally 2 times a day while awake for chronic pain   haloperidol 5 mg/ml injectable solution  1 milligram(s) injectable every 8 hours, As Needed - for  Agitation   lisinopril 40 mg oral tablet  1 tab(s) orally once a day   divalproex sodium 500 mg oral delayed release tablet  1 tab(s) orally 3 times a day (with meals)   nystatin 100,000 units/ml oral suspension  5 milliliter(s) orally every 6 hours   memantine 5 mg oral tablet  1 tab(s) orally once a day (at bedtime)   levaquin 250 mg oral tablet  1 tab(s) orally once a day    STOP TAKING THE FOLLOWING MEDICATION(S):    quetiapine 25 mg oral tablet: 1 tab(s) orally every 4 hours, As Needed  Physician's Instructions:  Diet Low Sodium   Activity Limitations As tolerated   Return to Work Not Applicable   Time frame for Follow Up Appointment 1-2 weeks  Dr. Blythe Stanford   Electronic Signatures: Darvin Neighbours, Artist  Reece Levy (MD)  (Signed 15-May-15 11:37)  Authored: ADMISSION DATE AND DIAGNOSIS, CHIEF COMPLAINT/HPI, Allergies, PERTINENT LABS, PERTINENT PAST HISTORY, HOSPITAL COURSE, DISCHARGE INSTRUCTIONS HOME MEDS, PATIENT INSTRUCTIONS   Last Updated: 15-May-15 11:37 by Alba Destine (MD)

## 2014-10-17 NOTE — Consult Note (Signed)
Brief Consult Note: Diagnosis: Mood disorder NOS, Cognitive disorder NOS.Marland Kitchen.   Patient was seen by consultant.   Recommend further assessment or treatment.   Orders entered.   Comments: Ms. Leano has a long history of cognitive decline accelerated by recent placement at the assisted living dementia unit. She was brought to the ER for psychiatric evaluation after a fall that was reported to be a suicide attempt. The patient reportedly made multiple suicidal statements at the facility and proceeded to "throw herself to the floor". She sustained nondisplaced fracture of the left orbit. The patient adamantly denies suicide attempt.   The patient has been treated with tegretol and Haldol for what appeared to be a manic episode with delusional. She has ben accepting medications this weekend.   MSE: Alert but oriented to person only. She recognizes me from previous encounters but is unsure what my role is. There is psychomotor retardation and poverty of speech. She does not remember much about this admision but is unconcerned. She is pleasant. No safety issues here. She adamantly denies any thoughts to hurt herself or others. No behavioral problems in the ED. She now accepts medications.   PLAN: 1. The patient is awaiting placement in SNF due to problems walking.    2. We will decrease haldol to 2 mg at bedtime. Tegretol for mood stabilization, will check level in am. Trazodone for sleep.  3. The patient is unable to ambulate independently and is at high risk of falling.  4. SW consult for placement.  5. I will follow along. Page if problems.  Electronic Signatures: Kristine LineaPucilowska, Jaidence Geisler (MD)  (Signed 15-Jun-15 16:30)  Authored: Brief Consult Note   Last Updated: 15-Jun-15 16:30 by Kristine LineaPucilowska, Syncere Eble (MD)

## 2014-10-17 NOTE — Consult Note (Signed)
Brief Consult Note: Diagnosis: Dementia with behavioral disturbance.   Patient was seen by consultant.   Consult note dictated.   Recommend further assessment or treatment.   Orders entered.   Comments: Kim [name] has a long history of cognitive decline accelerated by recent placement. She was brought to the ER for psychiatric evaluation after a fall that was reported to be a suicide attempt.    The patient denies any suicidal or hiomicidal thinking.  PLAN: 1. The patient does not meet criteria for IVC. Please discharge as appropriate.  2. Spoke with Kim [name], SW, who would assist us in disposition.   3. We will continue medications as in the facility. She is on multiple medications with strong anticholimergic properties. This is not indicated in elderly.  4. I will follow along. Page if problems.  Electronic Signatures: Kim [name], Kim [name] (MD)  (Signed 09-Jun-15 13:30)  Authored: Brief Consult Note   Last Updated: 09-Jun-15 13:30 by Kim [name], Kim [name] (MD)

## 2014-10-17 NOTE — Consult Note (Signed)
Brief Consult Note: Diagnosis: Alzheimer's dementia with behavioral disturbance, UTI delirium, R/O manic episode.   Patient was seen by consultant.   Consult note dictated.   Recommend further assessment or treatment.   Orders entered.   Comments: Ms. Florendo has a long history of cognitive decline accelerated by change of her surroundings in the context of recent surgery and rehab participation. She was brought to the ER severely agitated requiring im Geodon injection. She is utterly and, at the moment, pleasantly confused. She was diagnozed with UTI and is being admitted to medicine.  She appears manic today: hyperverbal, racing thoughts, insomniac, grandiose, hypersexual, delusional and paranoid.  PLAN: 1. Please continue vigorous treatment of UTI as underlying couse of delirium.  2. Will increase Depakote to 500 mg tid and Zyprexa to 5 mg tid for mood stabilization/psychosis.  3. Insomnia-will give 30 mg of restoril.   4. Will start Namenda for dementia as soon as delirium resolved.  5. I restarted Valium and narcotics. The patient has been on Valium and narcotic pain killers for the last 20 years. Even though this is not the best treatment for someone in delirium, we may not want her to go into withdrawals.   6. I will follow along. Page if problems.  Electronic Signatures: Kristine LineaPucilowska, Shakir Petrosino (MD)  (Signed 12-May-15 16:14)  Authored: Brief Consult Note   Last Updated: 12-May-15 16:14 by Kristine LineaPucilowska, Lasean Rahming (MD)

## 2014-10-17 NOTE — Discharge Summary (Signed)
PATIENT NAME:  Kim [name], [name] MR#:  [id number] DATE OF BIRTH:  Nov 23, 1945  DATE OF ADMISSION:  11/03/2013  DATE OF DISCHARGE:  11/10/2013  PRIMARY CARE PHYSICIAN:  Dr. Maryellen PileEason  Please see discharge summary dictated by Dr. Elpidio AnisSudini on May 15.   FINAL DIAGNOSES: 1.  Enterococcus urinary tract infection.  2.  Dementia with acute delirium.   3.  Psychosis.  4.  Chronic constipation.  5.  Hypertension, with history of orthostatic hypotension.   CODE STATUS:  The patient is a DNR.   MEDICATIONS ON DISCHARGE: Include ranitidine 150 mg twice a day, psyllium 3.4 grams per 7 grams oral powder for reconstitution, 1 packet in water or beverage daily, Ensure liquid 1 can twice a day, promethazine 25 mg every 4 hours as needed for nausea/vomiting, Fleet Enema 7 gram per 19 gram rectal enema every 3 days as needed for constipation, if not relieved by MOM and bisacodyl suppository, acetaminophen 325 mg 2 tablets every 4 hours as needed for pain or temperature, olanzapine 5 mg 1 tablet twice a day, acetaminophen/hydrocodone 325/10, 1 tablet t.i.d. standing dose while awake, diazepam 5 mg 1 tablet t.i.d., standing dose while awake, metoprolol 25 mg extended-release once a day at bedtime, lactulose 30 mL once a day in the afternoon, Depakote 500 mg delayed-release tablet, 1 tablet 3 times a day with meals, nystatin 100,000 units/mL swish and swallow 5 mL every 6 hours, chlorpromazine 50 mg at bedtime, glycerin adult rectal suppository 1 rectal suppository once a day as needed for constipation, Levaquin 250 mg 1 tablet once a day for 4 more days, Namenda 5 mg once a day at bedtime.   DIET: Low sodium diet, regular consistency.   ACTIVITY: As tolerated.   FOLLOWUP:  1 to 2 weeks with Dr. Jennet MaduroPucilowska 1 to 2 days with doctor at assisted living.   HOSPITAL COURSE: Please see interim summary by Dr. Elpidio AnisSudini on May 15. This will be an addendum to that. Creatinine upon discharge 1.5. Wrist x-ray on the 15th showed status  post internal fixation of distal radius fracture. No new fracture or dislocation noted. Depakote level 78 ammonia level 14.  1. For her enterococcus urinary tract infection, she will be treated with a course of Levaquin 4 more days upon discharge.   2. Dementia, with acute delirium. Patient was talking coherently upon discharge.   3. Psychiatric consultation by Dr. Jennet MaduroPucilowska, who adjusted medications. She is on olanzapine and chlorpromazine.   4. Psychosis. This had cleared.   5. Chronic constipation. She is on psyllium packet in the morning, lactulose in the afternoon, p.r.n. enemas and suppositories.   6. Hypertension, with history of orthostatic hypotension. I lowered the dose of her metoprolol like she takes at home. She is advised to stay hydrated to avoid orthostatic hypotension. I stopped her lisinopril. I would rather have her blood pressure on the higher side than too low.  Time spent on discharge: 35 minutes.    ____________________________ Herschell Dimesichard J. Renae GlossWieting, MD rjw:mr D: 11/10/2013 16:41:24 ET T: 11/10/2013 20:23:25 ET JOB#: [id number]  cc: Herschell Dimesichard J. Renae GlossWieting, MD, <Dictator> Serita ShellerErnest B. Maryellen PileEason, MD  Salley ScarletICHARD J Braylen Denunzio MD ELECTRONICALLY SIGNED 11/12/2013 14:03
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
## 2014-10-17 NOTE — Consult Note (Signed)
Brief Consult Note: Diagnosis: Alzheimer's dementia with behavioral disturbance, UTI delirium, R/O manic episode.   Patient was seen by consultant.   Consult note dictated.   Recommend further assessment or treatment.   Orders entered.   Comments: Kim [name] has a long history of cognitive decline accelerated by change of her surroundings in the context of recent surgery and rehab participation. She was brought to the ER severely agitated requiring im Geodon injection. She is utterly and, at the moment, pleasantly confused. She was diagnosed with UTI and admitted to medicine.  Yesterday she was fast asleep and could not be interviewed. Today she is alert, pleasantly and utterly confused, eating lunch. There is no agitation. Per sitter and patient, she slept through the night with thorazine. She accepts medications of Zyprexa and depakote. I checked ammonia today to rule out confusion from hyperammonemia. It was normal.   She is still hyperverbal, bubbling away, with racing disorganized thought, paranoia and grandiose delusions. She is no longer hypersexual.   PLAN: 1. Please continue Depakote and Zyprexa for mood stabilization/psychosis. VPA level in am.  2. Insomnia-will continue Thorazine as she did not responded to sleeping aids.    3. Will start Namenda for dementia as soon as delirium resolved.  4. The patient awaits placement in dementia unit.   5. I will follow along. Page if problems.  Electronic Signatures: Kim [name], Kim [name] (MD)  (Signed 15-May-15 00:02)  Authored: Brief Consult Note   Last Updated: 15-May-15 00:02 by Kim [name], Kim [name] (MD)

## 2014-10-17 NOTE — Consult Note (Signed)
Brief Consult Note: Diagnosis: Mood disorder NOS, Cognitive disorder NOS.Marland Kitchen.   Patient was seen by consultant.   Recommend further assessment or treatment.   Orders entered.   Comments: Ms. Ray has a long history of cognitive decline accelerated by recent placement at the assisted living dementia unit. She was brought to the ER for psychiatric evaluation after a fall that was reported to be a suicide attempt. The patient reportedly made multiple suicidal statements at the facility and proceeded to "throw herself to the floor". She sustained nondisplaced fracture of the left orbit. The patient adamantly denies suicide attempt.   The patient has been treated with tegretol and Haldol for what appeared to be a manic episode with delusional. She has ben accepting medications. Tegretol level 11, slightly too hogh as a mood stabilizer. She slept at least part of the night.   MSE: Alert but oriented to person only. She recognizes me from previous encounters but is unsure what my role is. There is psychomotor retardation and poverty of speech. She does not remember much about this admision but is unconcerned. She is pleasant. No safety issues here. She adamantly denies any thoughts to hurt herself or others. No behavioral problems in the ED. She now accepts medications.   PLAN: 1. The patient is awaiting placement in SNF due to problems walking.    2. We will decrease haldol to 2 mg at bedtime. Will hold Tegretol toninght and restart at 100 mg bid in am. Trazodone is used for sleep.  3. The patient is unable to ambulate independently and is at high risk of falling.  4. SW consult for placement.  5. I will follow along. Page if problems.  Electronic Signatures: Kristine LineaPucilowska, Jolanta (MD)  (Signed 16-Jun-15 12:58)  Authored: Brief Consult Note   Last Updated: 16-Jun-15 12:58 by Kristine LineaPucilowska, Jolanta (MD)

## 2014-10-17 NOTE — H&P (Signed)
PATIENT NAME:  Kim [name], Kim [name] MR#:  [id number] DATE OF BIRTH:  08-31-1945  DATE OF ADMISSION:  11/03/2013  PRIMARY CARE PROVIDER: Serita ShellerErnest B. Maryellen PileEason, MD  EMERGENCY DEPARTMENT REFERRING PHYSICIAN: Dr. Margarita [name].   CHIEF COMPLAINT: Altered mental status, agitation and anxiety.   HISTORY OF PRESENT ILLNESS: The patient is a 69 year old white female with progressive dementia who was currently in a rehab facility after she was discharged on the 24th after she had surgery to her wrist due to a right distal radius fracture. Prior to that, she was living with her husband, however, she had issues with staying up at night and had problems with agitation. However, the husband did not tell her his daughter but finally he told them when she had the fall. She was sent to the rehab facility where she has been agitated and has been confused. She was brought to the ED. Initially, they felt that this was a psychiatric-related issue going on. Finally, the ED physician that saw her today checked her urinalysis and urinalysis did show 3+ leukocytes, WBCs are 265, so there is concern that some of her symptoms may be related to a urinary tract infection. The patient, at the time of my interview, is tangential and is confused. She is not sure where is, she does not recall having surgery to her wrist. She is unable to give me any history. Her daughter, who works at the hospital, was able to obtain some history from her. Otherwise review of systems unobtainable.   PAST MEDICAL HISTORY: 1.  History of neurogenic bladder.  2.  Degenerative joint disease.  3.  History of asthma.  4.  History of pulmonary embolism.  5.  Low back pain and neck pain.  6.  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  7.  Hypertension.   PAST SURGICAL HISTORY: Status post cholecystectomy, gastric surgery, bladder surgery, hysterectomy, right knee surgery, left shoulder surgery, right shoulder surgery and recent wrist surgery of the right.    ALLERGIES: TO MULTIPLE MEDICATIONS INCLUDING,  BUPRENEX, CODEINE, MACRODANTIN, STADOLOL, TALWIN, ZOFRAN, LATEX.   MEDICATIONS: Tapentadol 75 mg 1 tab q.4 to 6 hours p.r.n., ranitidine 150 b.i.d., quetiapine 25 mg 1 tab p.o. q.4 hours p.r.n., psyllium 2.4 grams 1 packet in water daily, promethazine 25 rectal suppository as needed, promethazine 25 mg 1 tab p.o. q.4 hours p.r.n., olanzapine 5 mg once a day at bedtime, metoprolol 25 mg 1 tab p.o. b.i.d., lactulose 30 mL daily as needed, haloperidol 5 mg/mL 1 mg q.2 hours as needed, Fleet enema as needed, diazepam 5 mg q.6 p.r.n., Tylenol 650 q.4 to 6 p.r.n.   SOCIAL HISTORY: Does not smoke. Does not drink. Currently resides at a rehab facility.   FAMILY HISTORY: Positive for hypertension.   REVIEW OF SYSTEMS: Unobtainable due to the patient being confused, not oriented to time or place.   PHYSICAL EXAMINATION: VITAL SIGNS: Temperature 97.9, pulse 59, respirations 20, blood pressure 157/82, O2 of 95%.  GENERAL: The patient is a well-developed, well-nourished female in no acute distress.  HEENT: Head atraumatic, normocephalic. Pupils equally round, reactive to light and accommodation. There is no conjunctival pallor. No sclerae icterus. Nasal exam shows no drainage or ulceration. Oropharynx is clear without any exudate.  NECK: Supple without any JVD.  CARDIOVASCULAR: Regular rate and rhythm. No murmurs, rubs, clicks or gallops.  LUNGS: Clear to auscultation bilaterally without any rales, rhonchi, wheezing.  ABDOMEN: Soft, nontender, nondistended. Positive bowel sounds x 4.  EXTREMITIES: No clubbing, cyanosis or edema.  SKIN: No rash.  LYMPHATICS: No lymph nodes palpable.  VASCULAR: Good DP, PT pulses.  PSYCHIATRIC: The patient currently is not anxious but not oriented to place or time.  NEUROLOGICAL: Cranial nerves II through XII grossly intact. No focal deficits.   LABORATORY, DIAGNOSTIC AND RADIOLOGICAL DATA:  Urinalysis: Nitrites negative,  leukocytes 3+, WBC's 265. Toxic urine drug screen opiates positive, benzodiazepines positive. WBC 9.0, hemoglobin 12.3, platelet count 302. Glucose 94, BUN 13, creatinine 1.53, sodium 137, potassium 3.5, chloride 101, CO2 22. LFTs showed AST of 42. Troponin less than 0.02. Chest x-ray no acute abnormality. CT scan of the head shows no acute intracranial process; there is mild chronic atrophy and small vessel ischemic changes.   ASSESSMENT AND PLAN: The patient is a 69 year old white female sent from [facility] [place] for confusion and agitation.  1.  Acute encephalopathy, possibly due to urinary tract infection. However, she likely also has underlying psychiatric pathology causing the majority of these symptoms. At this time, we will treat her with IV Levaquin since she refused taking IV Rocephin. Psychiatric evaluation.  2.  Agitation. We will continue p.r.n., diazepam and Zyprexa.  Psychiatric evaluation if needed, can do haloperidol.  3.  Hypertension. Continue metoprolol.  4.  Miscellaneous. The patient will be on deep vein thrombosis prophylaxis with Lovenox.   TIME SPENT ON THIS PATIENT'S [name] AND P: Is 45 minutes.    ____________________________ Kim Scotts Allena Katz, MD shp:cs D: 11/03/2013 14:58:47 ET T: 11/03/2013 15:18:48 ET JOB#: [id number]  cc: Kim [name]. Allena Katz, MD, <Dictator> Kim Carwin MD ELECTRONICALLY SIGNED 11/07/2013 8:33

## 2014-10-17 NOTE — Consult Note (Signed)
Brief Consult Note: Diagnosis: Alzheimer's dementia with behavioral disturbance, UTI delirium.   Patient was seen by consultant.   Consult note dictated.   Recommend further assessment or treatment.   Orders entered.   Comments: Kim [name] has a long history of cognitive decline accelerated by change of her surroundings in the context of recent surgery and rehab participation. She was brought to the ER severely agitated requiring im Geodon injection. She is utterly and, at the moment, pleasantly confused. She was diagnozed with UTI and is being admitted to medicine.  PLAN: 1. Please continue vigorous treatment of UTI as underlying couse of delirium.  2. Will start Namenda for dementia as soon as delirium resolved.  3. Will start Depakote and Zyprexa for mood stabilization.  4. I restarted Valium and narcotics. The patient has been on Valium and narcotic pain killers for the last 20 years. Even though this is not the best treatment for someone in delirium, we may not want her to go into withdrawals.   5. I will follow along..  Electronic Signatures: Kristine LineaPucilowska, Deshanta Lady (MD)  (Signed 11-May-15 13:45)  Authored: Brief Consult Note   Last Updated: 11-May-15 13:45 by Kristine LineaPucilowska, Adja Ruff (MD)
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
## 2014-10-17 NOTE — Consult Note (Signed)
PATIENT NAME:  Kim [name], [name] MR#:  [id number] DATE OF BIRTH:  06-09-1946  DATE OF CONSULTATION:  10/11/2013  REFERRING PHYSICIAN:   CONSULTING PHYSICIAN:  Kim MannanPeter Aveion Nguyen, MD  PHYSICIAN REQUESTING CONSULTATION: Dr. Mayford KnifeWilliams of the Emergency Department.  CHIEF COMPLAINT: Right wrist pain.  HISTORY OF PRESENT ILLNESS: The patient is a 69 year old disabled female who presents following a fall inside of her home. She reports that she fell in her home. She fell on the outstretched right upper extremity. She had no preceding symptoms. She had no loss of consciousness. She remembers the fall. She fell on the outstretched right upper extremity. She had immediate pain and deformity of the right upper extremity. She presents for evaluation.  Of note, the patient states that she has carpal tunnel syndrome bilaterally, for which she has been treated with splints. She has not had surgery for CTS.  PAST MEDICAL HISTORY: Extensive. Please see the detailed medical record.  MEDICATION: See EMR.  ALLERGIES: MULTIPLE. PLEASE SEE EMR.   SOCIAL HISTORY: The patient lives in a mobile home with her 67 year old husband. They are both on disability. She does not smoke. She is a retired Engineer, civil (consulting)nurse. Her daughter and granddaughter both work here at [employer] [facility] [employer].  REVIEW OF SYSTEMS: The patient has multiple medical problems including gastrointestinal, neurologic (peripheral neuropathy) and other musculoskeletal including ankle pain and back pain.  PHYSICAL EXAMINATION: GENERAL: The patient is an obese female. She is in no apparent distress. She talks incessantly during the intgerview, exam and the procedures. She has a nearly a stream of consciousness verbalization, requires a lot of redirection during the interview but is overall pleasant. HEENT: Head is atraumatic. CHEST: Equal and symmetric. ABDOMEN: Soft. FEET: Bilateral cavus feet without open wounds. EXTREMITY: Right upper extremity: She has a dinner fork deformity to the  right upper extremity. She has pain over the distal radius. She reports that she has numbness in her thumb, index, and long finger but this is unchanged from her stable baseline state.  IMAGING: X-rays demonstrate a right distal radius fracture with extensive comminution. Initial dorsal displacement is greater than 60 degrees.  PROCEDURE: A closed reduction of the right distal radius fracture was performed (CPT code 0454025605). The procedure was discussed with the patient and she gave her consent to proceed. A hematoma block was performed with 20 mL of lidocaine. The patient had a dense anesthetic from this. Longitudinal traction and recreation of the deformity and translation and flexion was performed. This was performed once with post procedure x-rays indicating an incomplete reduction. It was then repeated a second time and a sugar tong splint was applied. Following the second reduction, the fracture has been over reduced into approximately 35 degrees of volar tilt. The length and radial inclination is acceptable.  ASSESSMENT: 69 year old female with a right distal radius fracture.  DISCUSSION: This is a 69 year old female with multiple medical problems with a right distal radius fracture. In my discussion with the patient, it sounds like that she is very minimally functional, borderline independent living. She lives in a mobile home with her 67 year old husband. She states that she has not left the mobile home in 15 years, other than to use a cane or a walker to walk down the front of the mobile home to the car to go to her various doctors' appointments. She lives an almost exclusively sedentary lifestyle staying in the recliner all day and occasionally going to the bed. She does very minimal activities.  Given the patient's low  functional status I would treat her more like a geriatric distal radius fracture and less like that of a younger, more active person. Her reduction is a bit over reduced but given  her dorsal comminution, I think this probably will settle with time into an acceptable alignment. She does have what appears to be a very small intraarticular stepoff of the lunate facet. This is approximately 2 mm, which I think is acceptable. If she maintains acceptable alignment, I think that she can be treated non-operatively.  The operative treatment for this fracture, should it continue to displace, would likely be a volar plate fixation. Given the fact that the patient has had previous carpal tunnel syndromes, even before this, I would consider a concomitant carpal tunnel release. I explained this to the patient.  The patient prefers to follow up in the [facility] where she has established orthopedic care. She may see one of their orthopedic surgeons in approximately 1 week. Would recommend repeat radiographs at that time. If she has unacceptable angulation, she should be considered for further intervention.  The plan was discussed with the patient and she is agreeable.    ____________________________ Kim Mannan, MD pa:lt D: 10/11/2013 23:39:38 ET T: 10/12/2013 02:55:28 ET JOB#: [id number]  cc: Kim Mannan, MD, <Dictator> Althea Grimmer. Mickle Plumb, MD PhD Orthopaedic Surgery ELECTRONICALLY SIGNED 10/12/2013 13:00

## 2014-10-17 NOTE — Op Note (Signed)
PATIENT NAME:  Kim [name], Kim [name] MR#:  [id number] DATE OF BIRTH:  1945/09/20  DATE OF PROCEDURE:  10/16/2013  PREOPERATIVE DIAGNOSIS:  Comminuted displaced right distal radius fracture.   POSTOPERATIVE DIAGNOSIS:  Comminuted displaced right distal radius fracture.  PROCEDURE:  ORIF right distal radius.   ANESTHESIA:  General.   SURGEON:  Kennedy BuckerMichael Chaeli Judy, M.D.   DESCRIPTION OF PROCEDURE:  The patient was brought to the Operating Room and after adequate anesthesia was obtained, the right arm was prepped and draped in the usual sterile fashion with a tourniquet applied to the upper arm.  After patient identification, timeout procedures were completed.  The tourniquet was raised to 250 mmHg.  Fingertrap traction was applied through the index and middle finger with the traction over the end of the table with 10 pounds of traction.  Volar incision was made over the FCR tendon with the FCR tendon retracted radially along with the radial artery and vein.  The pronator was identified and elevated off the distal fragment.  The fracture was comminuted intra-articular with multiple fragments distally with the prior injury.  There was impaction and a large defect in the distal fragment and the metaphyseal bone.  With traction applied, a nearly anatomic reduction was obtained, 1 mL of AlloMatrix bone putty was inserted into the defect and this appeared to fill it well.  A short standard DVR plate was then applied, pinned in place distally and then three proximal screws placed with 10 mm screws inserted.  With the wrist in slight flexion, smooth pegs were placed through the DVR fast guides, drilling, measuring and placing the smooth pegs.  The guidewire was removed and the remaining peg holes were filled.  The C-arm was checked carefully after all hardware was placed.  The traction was removed.  The fracture appeared stable.  There is no penetrating hardware dorsally.  The wound was then irrigated, the tourniquet let down.   Hemostasis was checked with electrocautery.  The wound was closed with 3-0 Vicryl subcutaneously and 4-0 nylon for the skin.  Xeroform, 4 x 4's, Webril and a volar splint were applied along with an Ace wrap.  Tourniquet time was 33 minutes at 250 mmHg.    COMPLICATIONS:  There were no complications.    SPECIMENS:  No specimens.   ESTIMATED BLOOD LOSS:  Minimal.   IMPLANTS:  [employer] hand innovations short standard DVR plate with multiple screws and pegs.     ____________________________ Leitha SchullerMichael J. Dalicia Kisner, MD mjm:ea D: 10/16/2013 20:09:20 ET T: 10/17/2013 06:21:37 ET JOB#: [id number]  cc: Leitha SchullerMichael J. Esma Kilts, MD, <Dictator> Leitha SchullerMICHAEL J Emie Sommerfeld MD ELECTRONICALLY SIGNED 10/17/2013 8:30

## 2015-08-22 IMAGING — CR DG CHEST 1V PORT
1 series · 1 of 1 positions shown · non-contrast
Comparison: 04/18/2010

CLINICAL DATA: Increasing confusion

EXAM:
PORTABLE CHEST - 1 VIEW

[x chest ap]
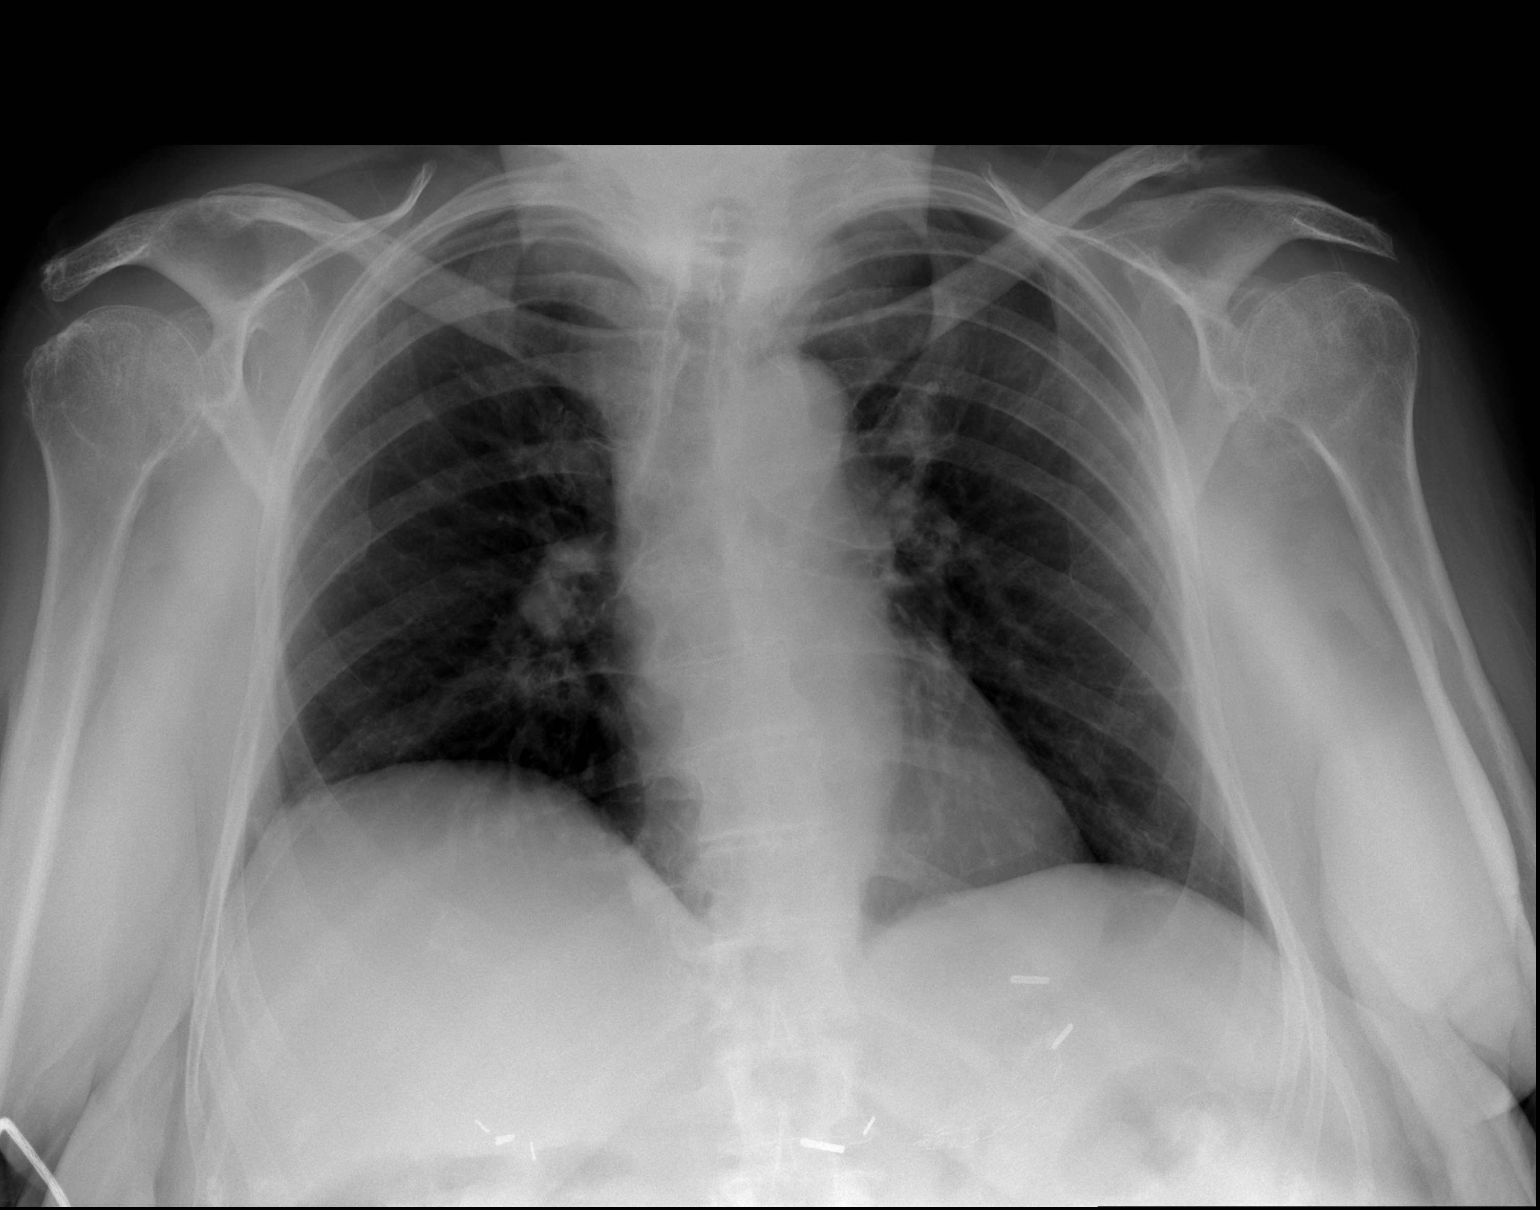

[1 of 1 positions shown; findings below may reference images not displayed]

FINDINGS: The heart size and mediastinal contours are within normal limits.
Both lungs are clear. The visualized skeletal structures are
unremarkable.
IMPRESSION: No active disease.

## 2015-09-22 IMAGING — CR DG CHEST 1V PORT
1 series · 1 of 1 positions shown · non-contrast
Comparison: Portable exam 9395 hr compared to 11/02/2013

CLINICAL DATA: Confusion, fall, history pulmonary embolism

EXAM:
PORTABLE CHEST - 1 VIEW

[ap]
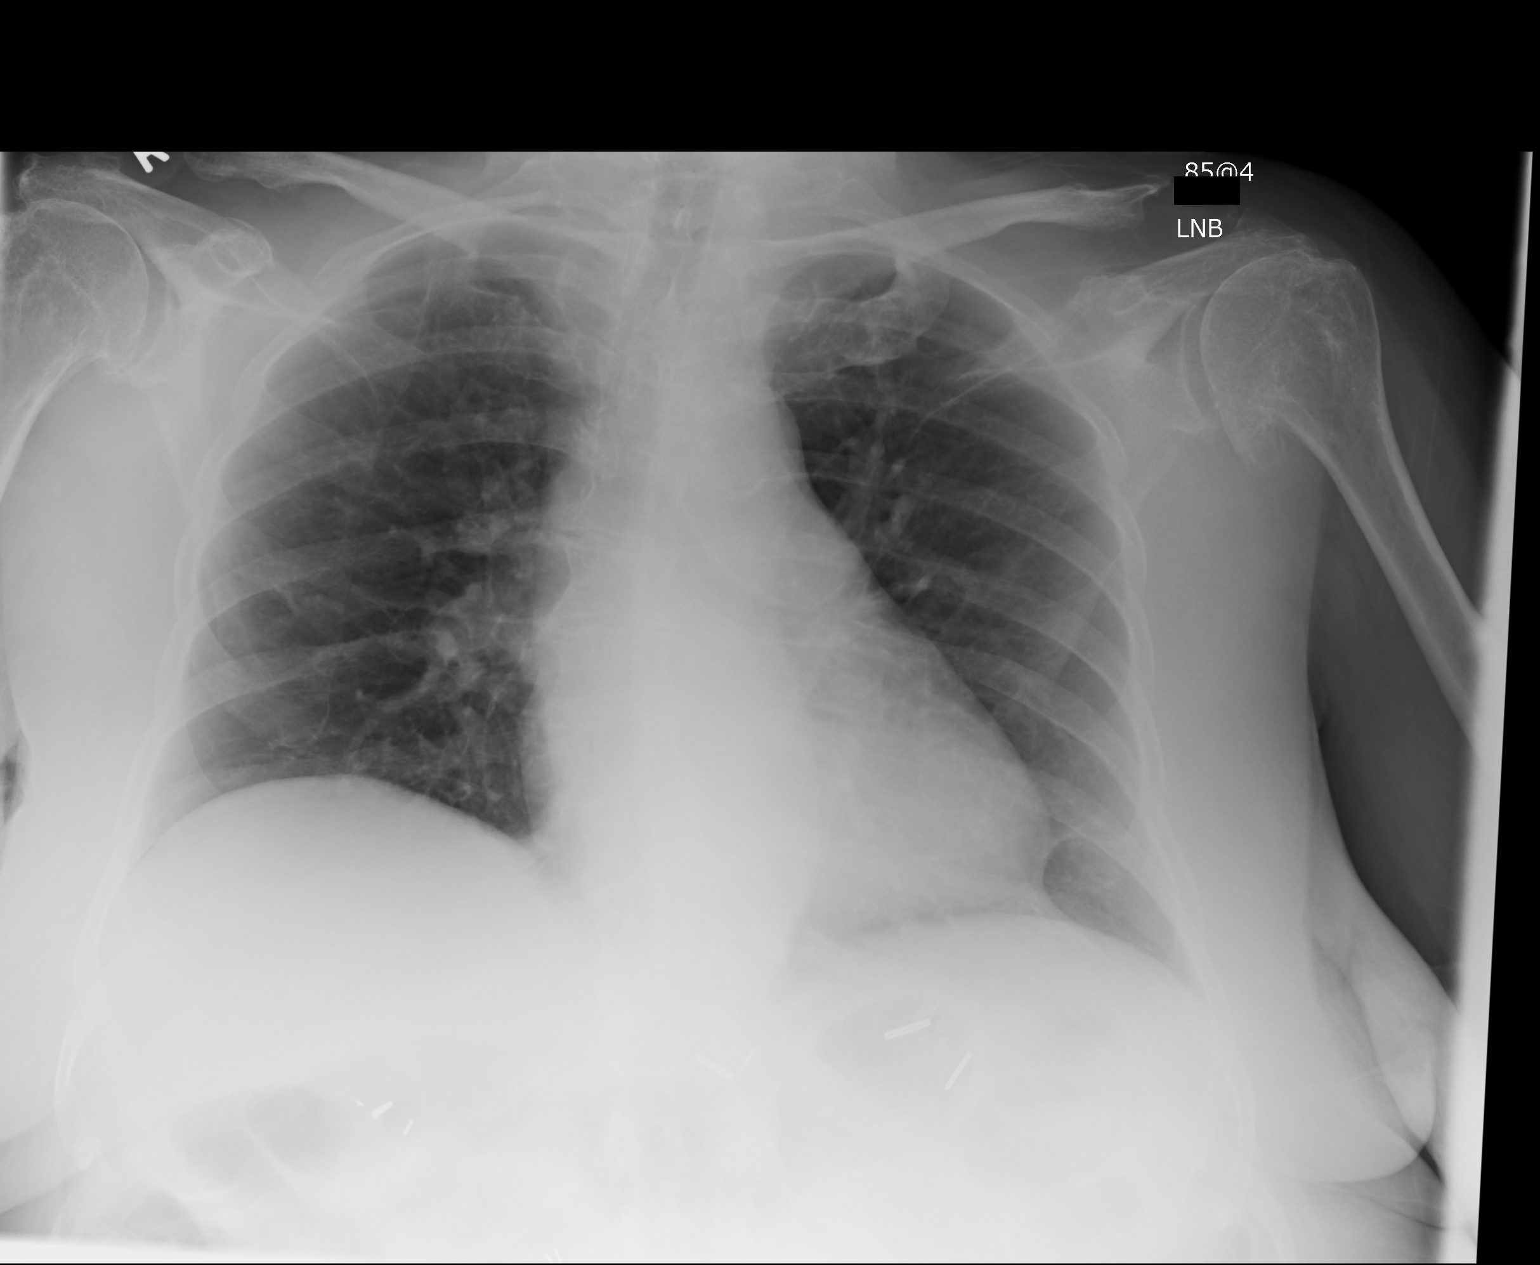

[1 of 1 positions shown; findings below may reference images not displayed]

FINDINGS: Normal heart size, mediastinal contours, and pulmonary vascularity
normal.

Lungs clear.

No pleural effusion or pneumothorax.

BILATERAL glenohumeral degenerative changes.

Question prior resection versus resorption of the distal clavicles
bilaterally.

BILATERAL upper quadrant abdominal surgical clips.
IMPRESSION: No acute abnormalities.

## 2015-10-08 ENCOUNTER — Emergency Department
Admission: EM | Admit: 2015-10-08 | Discharge: 2015-10-08 | Disposition: A | Discharge status: Skilled Nursing Facility | Payer: Medicare Other | Attending: Emergency Medicine | Admitting: Emergency Medicine

## 2015-10-08 ENCOUNTER — Emergency Department: Payer: Medicare Other

## 2015-10-08 ENCOUNTER — Encounter: Payer: Self-pay | Admitting: Emergency Medicine

## 2015-10-08 DIAGNOSIS — Y9389 Activity, other specified: Secondary | ICD-10-CM | POA: Diagnosis not present

## 2015-10-08 DIAGNOSIS — S0990XA Unspecified injury of head, initial encounter: Secondary | ICD-10-CM | POA: Diagnosis present

## 2015-10-08 DIAGNOSIS — M1990 Unspecified osteoarthritis, unspecified site: Secondary | ICD-10-CM | POA: Insufficient documentation

## 2015-10-08 DIAGNOSIS — G8929 Other chronic pain: Secondary | ICD-10-CM | POA: Insufficient documentation

## 2015-10-08 DIAGNOSIS — Z86711 Personal history of pulmonary embolism: Secondary | ICD-10-CM | POA: Insufficient documentation

## 2015-10-08 DIAGNOSIS — J45909 Unspecified asthma, uncomplicated: Secondary | ICD-10-CM | POA: Diagnosis not present

## 2015-10-08 DIAGNOSIS — Y92129 Unspecified place in nursing home as the place of occurrence of the external cause: Secondary | ICD-10-CM | POA: Diagnosis not present

## 2015-10-08 DIAGNOSIS — Z79899 Other long term (current) drug therapy: Secondary | ICD-10-CM | POA: Diagnosis not present

## 2015-10-08 DIAGNOSIS — S0003XA Contusion of scalp, initial encounter: Secondary | ICD-10-CM

## 2015-10-08 DIAGNOSIS — E785 Hyperlipidemia, unspecified: Secondary | ICD-10-CM | POA: Diagnosis not present

## 2015-10-08 DIAGNOSIS — W010XXA Fall on same level from slipping, tripping and stumbling without subsequent striking against object, initial encounter: Secondary | ICD-10-CM | POA: Insufficient documentation

## 2015-10-08 DIAGNOSIS — N390 Urinary tract infection, site not specified: Secondary | ICD-10-CM | POA: Diagnosis not present

## 2015-10-08 DIAGNOSIS — Y999 Unspecified external cause status: Secondary | ICD-10-CM | POA: Diagnosis not present

## 2015-10-08 DIAGNOSIS — M545 Low back pain: Secondary | ICD-10-CM | POA: Insufficient documentation

## 2015-10-08 DIAGNOSIS — I10 Essential (primary) hypertension: Secondary | ICD-10-CM | POA: Insufficient documentation

## 2015-10-08 LAB — URINALYSIS COMPLETE WITH MICROSCOPIC (ARMC ONLY)
BILIRUBIN URINE: NEGATIVE
Glucose, UA: NEGATIVE mg/dL
HGB URINE DIPSTICK: NEGATIVE
Leukocytes, UA: NEGATIVE
Nitrite: NEGATIVE
Protein, ur: 30 mg/dL — AB
SPECIFIC GRAVITY, URINE: 1.024 (ref 1.005–1.030)
pH: 6 (ref 5.0–8.0)

## 2015-10-08 MED ORDER — CEPHALEXIN 500 MG PO CAPS
500.0000 mg | ORAL_CAPSULE | Freq: Once | ORAL | Status: AC
  Administered 2015-10-08: 500 mg via ORAL
  Filled 2015-10-08: qty 1

## 2015-10-08 MED ORDER — CEPHALEXIN 500 MG PO CAPS: 500.0000 mg | ORAL_CAPSULE | Freq: Two times a day (BID) | ORAL | Status: AC

## 2015-10-08 NOTE — ED Notes (Addendum)
Per [facility]- Patient comes from [facility] where staff was assisting her with a shower and reports patient fell and hit her head. Staff reports patient was "acting different" from her baseline. Pt is A&O x1. Unknown LOC. Patient denies pain.

## 2015-10-08 NOTE — ED Notes (Signed)
MD at bedside. 

## 2015-10-08 NOTE — ED Provider Notes (Signed)
[facility] Emergency Department Provider Note  ____________________________________________  Time seen: 4:00 PM  I have reviewed the triage vital signs and the nursing notes.   HISTORY  Chief Complaint Fall  Level 5 caveat:  Portions of the history and physical were unable to be obtained due to the patient's chronic dementia   HPI Kim [name] is a 70 y.o. female sent to the ED from [facility] with a report of a mechanical fall the shower. This was witnessed by staff who were assisting the patient with her shower. She slipped and fell forward and hit her forehead against the shower. No loss of consciousness. Patient denies any headache vision changes or neck pain. No numbness tingling or weakness. Denies any other symptoms.     Past Medical History  Diagnosis Date  . Chronic back pain   . DJD (degenerative joint disease)   . Spinal stenosis   . History of neurogenic bladder     with chronic indwelling Foley  . GERD (gastroesophageal reflux disease)   . History of pulmonary embolism 1990  . Syncope and collapse   . Hyperlipidemia   . Hypertension   . Kidney stones   . Asthma      Patient Active Problem List   Diagnosis Date Noted  . Dizziness 02/26/2013  . Essential hypertension 02/26/2013  . Physical deconditioning 02/26/2013  . Syncope 02/26/2013     Past Surgical History  Procedure Laterality Date  . Stomach surgery       Current Outpatient Rx  Name  Route  Sig  Dispense  Refill  . allopurinol (ZYLOPRIM) 300 MG tablet   Oral   Take 300 mg by mouth daily.         . cephALEXin (KEFLEX) 500 MG capsule   Oral   Take 1 capsule (500 mg total) by mouth 2 (two) times daily.   20 capsule   0   . diazepam (VALIUM) 5 MG tablet   Oral   Take 5 mg by mouth every 6 (six) hours as needed.          Marland Kitchen HYDROcodone-acetaminophen (NORCO) 10-325 MG per tablet   Oral   Take 1 tablet by mouth every 6 (six) hours as  needed for pain.          . metoprolol tartrate (LOPRESSOR) 25 MG tablet   Oral   Take 1 tablet (25 mg total) by mouth 2 (two) times daily.   60 tablet   3   . potassium chloride SA (K-DUR,KLOR-CON) 20 MEQ tablet   Oral   Take 20 mEq by mouth daily.         . ranitidine (ZANTAC) 150 MG tablet   Oral   Take 150 mg by mouth 2 (two) times daily.          . verapamil (CALAN-SR) 120 MG CR tablet   Oral   Take 1 tablet (120 mg total) by mouth at bedtime.   30 tablet   6      Allergies Buprenex; Codeine; Cortisone; Latex; Macrodantin; and Zofran   Family History  Problem Relation Age of Onset  . Hypertension Father   . Heart attack Mother     Social History Social History  Substance Use Topics  . Smoking status: Never Smoker   . Smokeless tobacco: None  . Alcohol Use: No    Review of Systems Limited by the patient's unreliable history due to dementia. Constitutional:   No fever  or chills.  Eyes:   No vision changes.  ENT:   No sore throat. No rhinorrhea. Cardiovascular:   No chest pain. Respiratory:   No dyspnea or cough. Gastrointestinal:   Negative for abdominal pain, vomiting and diarrhea.  No bloody stool. Genitourinary:   Negative for dysuria or difficulty urinating. Musculoskeletal:   Negative for focal pain or swelling Neurological:   Negative for headaches 10-point ROS otherwise negative. ____________________________________________   PHYSICAL EXAM:  VITAL SIGNS: ED Triage Vitals  Enc Vitals Group     BP 10/08/15 1620 131/90 mmHg     Pulse Rate 10/08/15 1620 71     Resp 10/08/15 1620 16     Temp 10/08/15 1620 97.9 F (36.6 C)     Temp Source 10/08/15 1620 Oral     SpO2 10/08/15 1620 97 %     Weight --      Height --      Head Cir --      [place] --      Pain Score 10/08/15 1620 0     Pain Loc --      Pain Edu? --      Excl. in GC? --     Vital signs reviewed, nursing assessments reviewed.   Constitutional:   Alert and  oriented To self and place. Well appearing and in no distress. Eyes:   No scleral icterus. No conjunctival pallor. PERRL. EOMI ENT   Head:   Normocephalic with large 5 cm scalp hematoma over the left forehead. There is also an overlying abrasion but no laceration. It is hemostatic. No focal bony tenderness or instability. TMs normal..   Nose:   No congestion/rhinnorhea. No septal hematoma   Mouth/Throat:   MMM, no pharyngeal erythema. No peritonsillar mass.    Neck:   No stridor. No SubQ emphysema. No meningismus. Hematological/Lymphatic/Immunilogical:   No cervical lymphadenopathy. Cardiovascular:   RRR. Symmetric bilateral radial and DP pulses.  No murmurs.  Respiratory:   Normal respiratory effort without tachypnea nor retractions. Breath sounds are clear and equal bilaterally. No wheezes/rales/rhonchi. Gastrointestinal:   Soft and nontender. Non distended. There is no CVA tenderness.  No rebound, rigidity, or guarding. Genitourinary:   deferred Musculoskeletal:   Left hip tenderness to palpation laterally, but no pain with range of motion of the hip and knee. No other bony tenderness. Neurologic:   Normal speech and language.  CN 2-10 normal. Motor grossly intact. No gross focal neurologic deficits are appreciated.  Skin:    Skin is warm, dry and intact. No rash noted.  No petechiae, purpura, or bullae.  ____________________________________________    LABS (pertinent positives/negatives) (all labs ordered are listed, but only abnormal results are displayed) Labs Reviewed  URINALYSIS COMPLETEWITH MICROSCOPIC ([facility] ONLY) - Abnormal; Notable for the following:    Color, Urine YELLOW (*)    APPearance CLEAR (*)    Ketones, ur TRACE (*)    Protein, ur 30 (*)    Bacteria, UA RARE (*)    Squamous Epithelial / LPF 0-5 (*)    All other components within normal limits  URINE CULTURE    ____________________________________________   EKG    ____________________________________________    RADIOLOGY  CT head unremarkable except for scalp hematoma over the left forehead. CT cervical spine unremarkable X-ray left hip unremarkable  ____________________________________________   PROCEDURES   ____________________________________________   INITIAL IMPRESSION / ASSESSMENT AND PLAN / ED COURSE  Pertinent labs & imaging results that were available during my care  of the patient were reviewed by me and considered in my medical decision making (see chart for details).  Patient presents with a mechanical fall in the shower. No serious injuries other than the scalp hematoma. Anticipatory guidance  given to family at bedside. Patient's urine has been going very strong smelling and she does have a history of frequent UTIs that are occasionally only proven on culture. We'll start her on Keflex due to the fall and suspicion that she may have a decompensation of her functional baseline because of UTI causing her to be more likely to fall. Otherwise she appears be medically stable, vital signs are normal. Discharge home.     ____________________________________________   FINAL CLINICAL IMPRESSION(S) / ED DIAGNOSES  Final diagnoses:  Urinary tract infection without hematuria, site unspecified  Scalp hematoma, initial encounter       Portions of this note were generated with dragon dictation software. Dictation errors may occur despite best attempts at proofreading.   Sharman CheekPhillip Schneider [place], MD 10/08/15 [phone]

## 2015-10-08 NOTE — Discharge Instructions (Signed)
Hematoma A hematoma is a collection of blood under the skin, in an organ, in a body space, in a joint space, or in other tissue. The blood can clot to form a lump that you can see and feel. The lump is often firm and may sometimes become sore and tender. Most hematomas get better in a few days to weeks. However, some hematomas may be serious and require medical care. Hematomas can range in size from very small to very large. CAUSES  A hematoma can be caused by a blunt or penetrating injury. It can also be caused by spontaneous leakage from a blood vessel under the skin. Spontaneous leakage from a blood vessel is more likely to occur in older people, especially those taking blood thinners. Sometimes, a hematoma can develop after certain medical procedures. SIGNS AND SYMPTOMS   A firm lump on the body.  Possible pain and tenderness in the area.  Bruising.Blue, dark blue, purple-red, or yellowish skin may appear at the site of the hematoma if the hematoma is close to the surface of the skin. For hematomas in deeper tissues or body spaces, the signs and symptoms may be subtle. For example, an intra-abdominal hematoma may cause abdominal pain, weakness, fainting, and shortness of breath. An intracranial hematoma may cause a headache or symptoms such as weakness, trouble speaking, or a change in consciousness. DIAGNOSIS  A hematoma can usually be diagnosed based on your medical history and a physical exam. Imaging tests may be needed if your health care provider suspects a hematoma in deeper tissues or body spaces, such as the abdomen, head, or chest. These tests may include ultrasonography or a CT scan.  TREATMENT  Hematomas usually go away on their own over time. Rarely does the blood need to be drained out of the body. Large hematomas or those that may affect vital organs will sometimes need surgical drainage or monitoring. HOME CARE INSTRUCTIONS   Apply ice to the injured area:   Put ice in a  plastic bag.   Place a towel between your skin and the bag.   Leave the ice on for 20 minutes, 2-3 times a day for the first 1 to 2 days.   After the first 2 days, switch to using warm compresses on the hematoma.   Elevate the injured area to help decrease pain and swelling. Wrapping the area with an elastic bandage may also be helpful. Compression helps to reduce swelling and promotes shrinking of the hematoma. Make sure the bandage is not wrapped too tight.   If your hematoma is on a lower extremity and is painful, crutches may be helpful for a couple days.   Only take over-the-counter or prescription medicines as directed by your health care provider. SEEK IMMEDIATE MEDICAL CARE IF:   You have increasing pain, or your pain is not controlled with medicine.   You have a fever.   You have worsening swelling or discoloration.   Your skin over the hematoma breaks or starts bleeding.   Your hematoma is in your chest or abdomen and you have weakness, shortness of breath, or a change in consciousness.  Your hematoma is on your scalp (caused by a fall or injury) and you have a worsening headache or a change in alertness or consciousness. MAKE SURE YOU:   Understand these instructions.  Will watch your condition.  Will get help right away if you are not doing well or get worse.   This information is not intended to replace  advice given to you by your health care provider. Make sure you discuss any questions you have with your health care provider.   Document Released: 01/25/2004 Document Revised: 02/12/2013 Document Reviewed: 11/20/2012 Elsevier Interactive Patient Education 2016 Else[employer] Inc[employer]  Urinary Tract Infection Urinary tract infections (UTIs) can develop anywhere along your urinary tract. Your urinary tract is your body's drainage system for removing wastes and extra water. Your urinary tract includes two kidneys, two ureters, a bladder, and a urethra. Your kidneys  are a pair of bean-shaped organs. Each kidney is about the size of your fist. They are located below your ribs, one on each side of your spine. CAUSES Infections are caused by microbes, which are microscopic organisms, including fungi, viruses, and bacteria. These organisms are so small that they can only be seen through a microscope. Bacteria are the microbes that most commonly cause UTIs. SYMPTOMS  Symptoms of UTIs may vary by age and gender of the patient and by the location of the infection. Symptoms in young women typically include a frequent and intense urge to urinate and a painful, burning feeling in the bladder or urethra during urination. Older women and men are more likely to be tired, shaky, and weak and have muscle aches and abdominal pain. A fever may mean the infection is in your kidneys. Other symptoms of a kidney infection include pain in your back or sides below the ribs, nausea, and vomiting. DIAGNOSIS To diagnose a UTI, your caregiver will ask you about your symptoms. Your caregiver will also ask you to provide a urine sample. The urine sample will be tested for bacteria and white blood cells. White blood cells are made by your body to help fight infection. TREATMENT  Typically, UTIs can be treated with medication. Because most UTIs are caused by a bacterial infection, they usually can be treated with the use of antibiotics. The choice of antibiotic and length of treatment depend on your symptoms and the type of bacteria causing your infection. HOME CARE INSTRUCTIONS  If you were prescribed antibiotics, take them exactly as your caregiver instructs you. Finish the medication even if you feel better after you have only taken some of the medication.  Drink enough water and fluids to keep your urine clear or pale yellow.  Avoid caffeine, tea, and carbonated beverages. They tend to irritate your bladder.  Empty your bladder often. Avoid holding urine for long periods of  time.  Empty your bladder before and after sexual intercourse.  After a bowel movement, women should cleanse from front to back. Use each tissue only once. SEEK MEDICAL CARE IF:   You have back pain.  You develop a fever.  Your symptoms do not begin to resolve within 3 days. SEEK IMMEDIATE MEDICAL CARE IF:   You have severe back pain or lower abdominal pain.  You develop chills.  You have nausea or vomiting.  You have continued burning or discomfort with urination. MAKE SURE YOU:   Understand these instructions.  Will watch your condition.  Will get help right away if you are not doing well or get worse.   This information is not intended to replace advice given to you by your health care provider. Make sure you discuss any questions you have with your health care provider.   Document Released: 03/22/2005 Document Revised: 03/03/2015 Document Reviewed: 07/21/2011 Elsevier Interactive Patient Education [employer]2016 Elsevier Inc.

## 2015-10-10 LAB — URINE CULTURE: Culture: NO GROWTH

## 2016-01-25 DEATH — deceased

## 2017-07-27 IMAGING — CT CT HEAD W/O CM
4 of 5 series · 18 of 30 positions shown, 19 images · non-contrast
Comparison: CT face 12/02/2013; CT head 11/02/2013

CLINICAL DATA: Patient status post fall and hit her head. No
reported loss consciousness.

EXAM:
CT HEAD WITHOUT CONTRAST
CT CERVICAL SPINE WITHOUT CONTRAST
TECHNIQUE: Multidetector CT imaging of the head and cervical spine was
performed following the standard protocol without intravenous
contrast. Multiplanar CT image reconstructions of the cervical spine
were also generated.

[Series 2: soft tissue · axial · 0.40mm/px · z∈[+320,+466]mm · 3 of 30 slices shown, 4 images]
[im 1/30  brain]
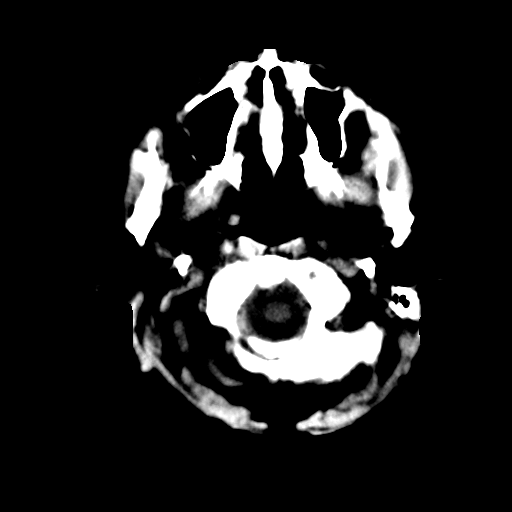
[im 1/30  bone]
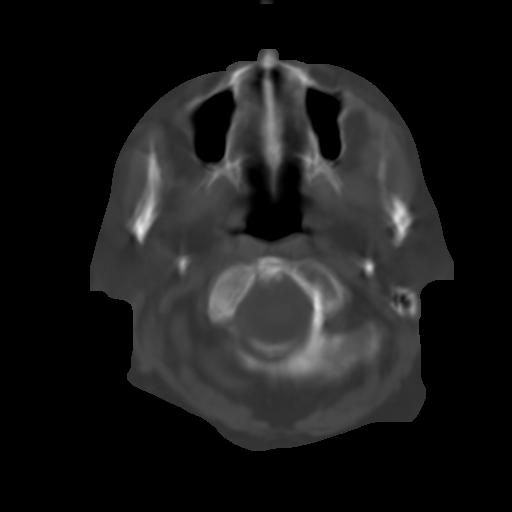
[im 15/30  brain]
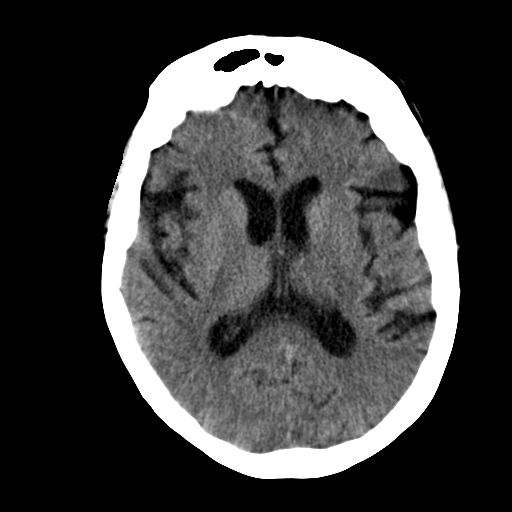
[im 30/30  brain]
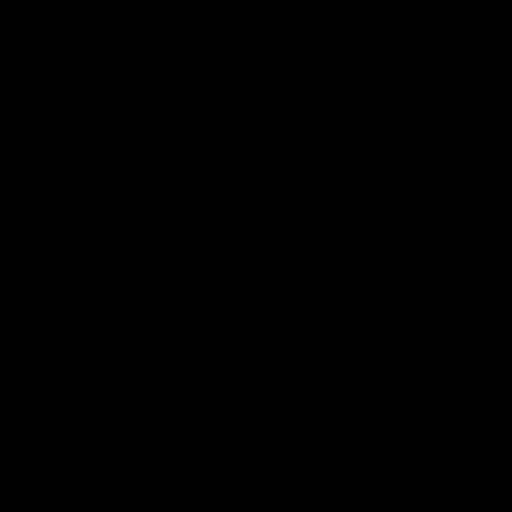

[Series 3: bone · axial · 0.40mm/px · z∈[+332,+450]mm · 6 of 83 slices shown]
[im 12/83  bone]
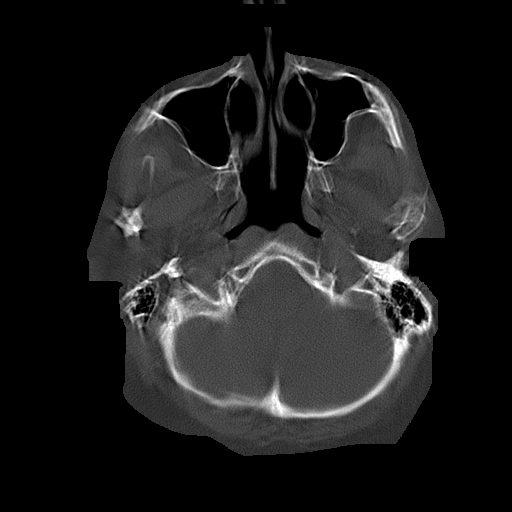
[im 24/83  bone]
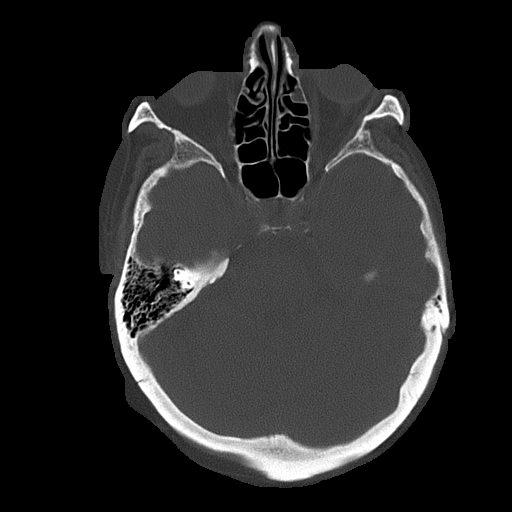
[im 36/83  bone]
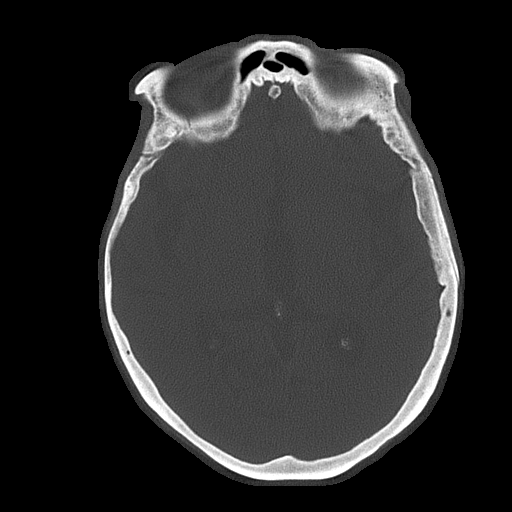
[im 47/83  bone]
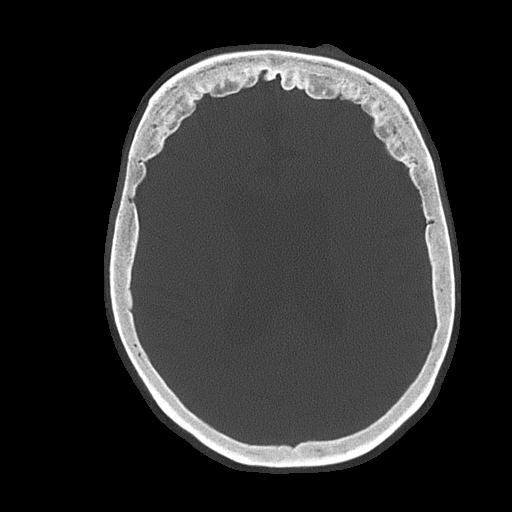
[im 59/83  bone]
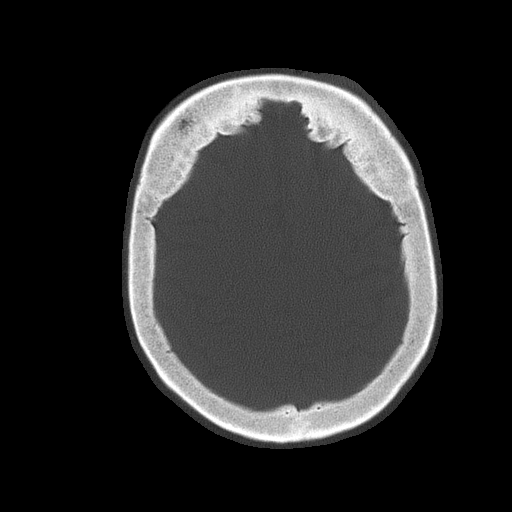
[im 71/83  bone]
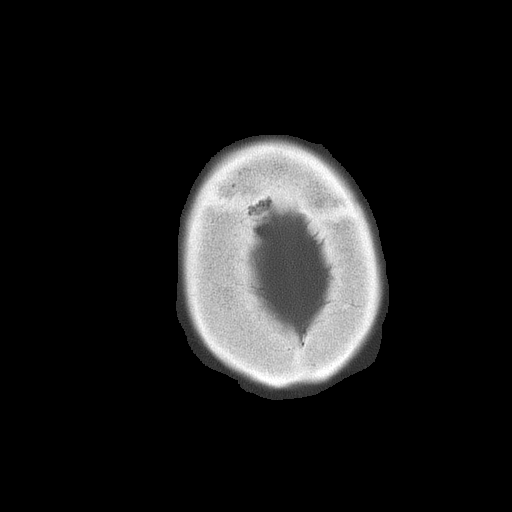

[Series 5: c spine soft · axial · 0.39mm/px · z∈[+208,+272]mm · 4 of 86 slices shown]
[im 11/86  brain]
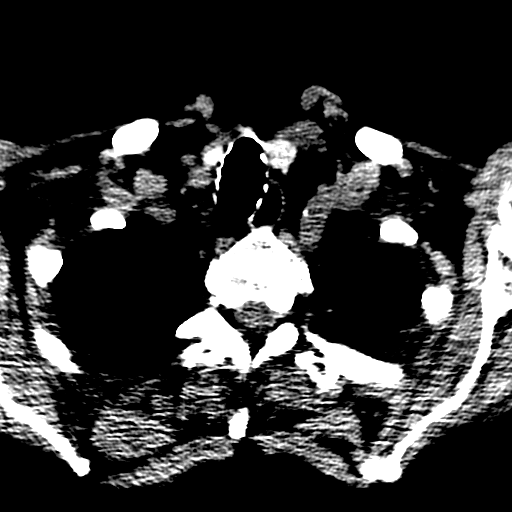
[im 22/86  brain]
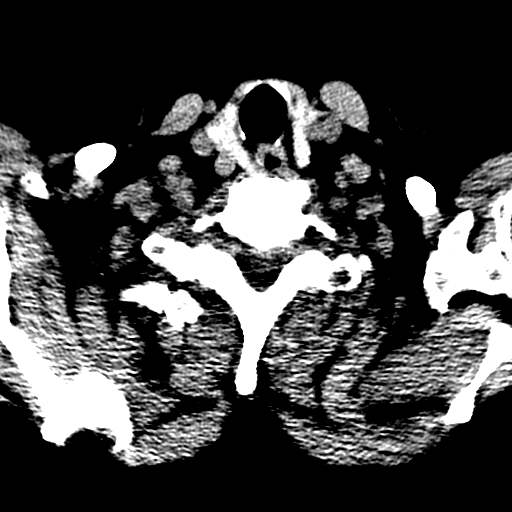
[im 32/86  brain]
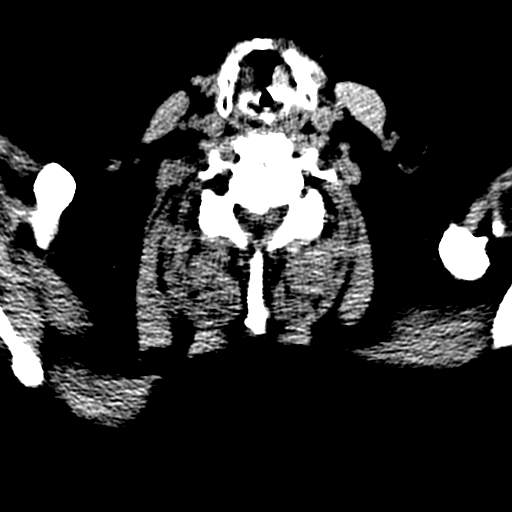
[im 43/86  brain]
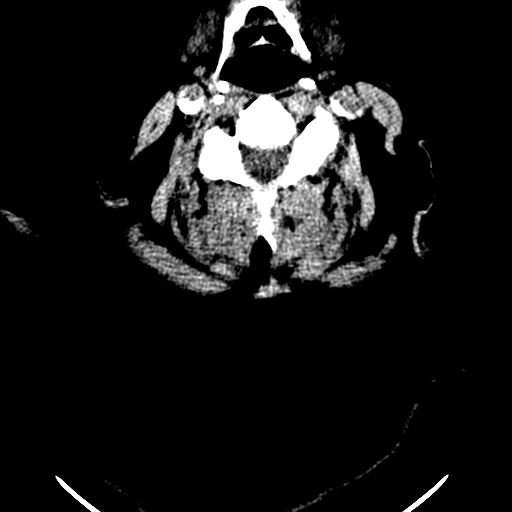

[Series 7: bone recon · axial · 0.42mm/px · z∈[+400,+489]mm · 5 of 72 slices shown]
[im 12/72  bone]
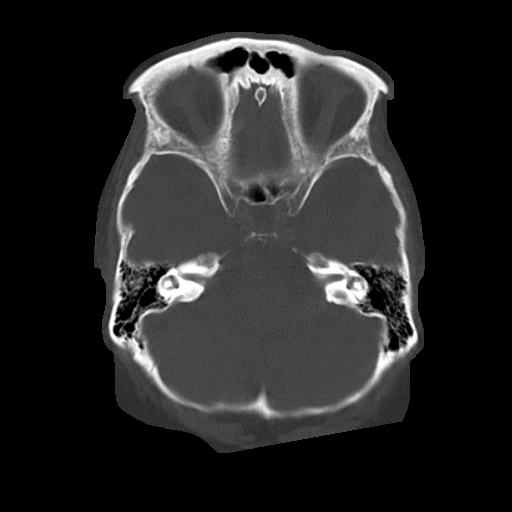
[im 24/72  bone]
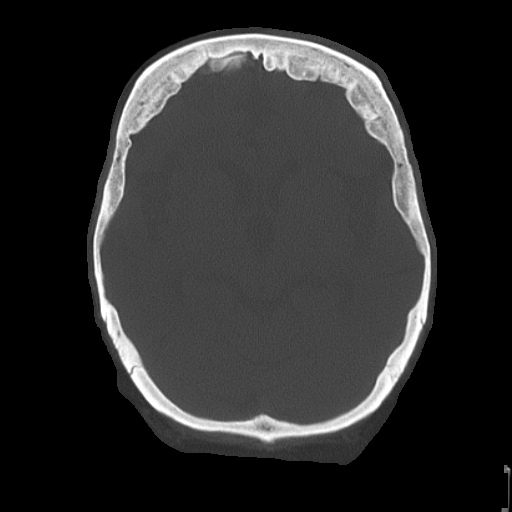
[im 36/72  bone]
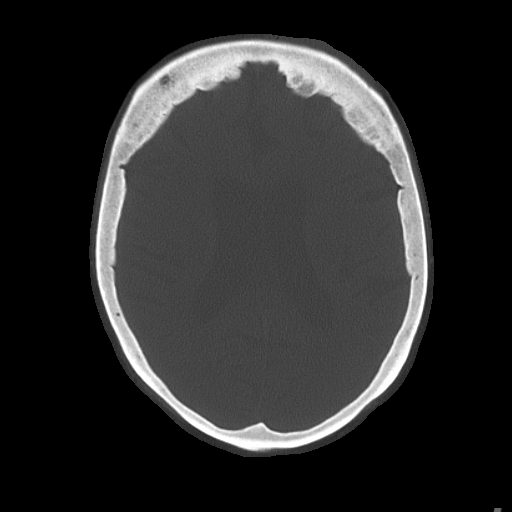
[im 48/72  bone]
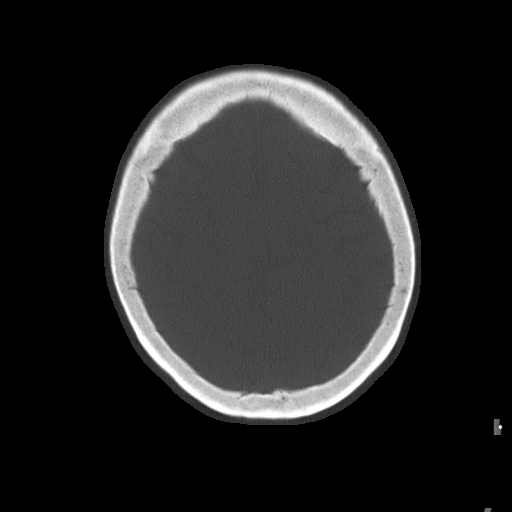
[im 60/72  bone]
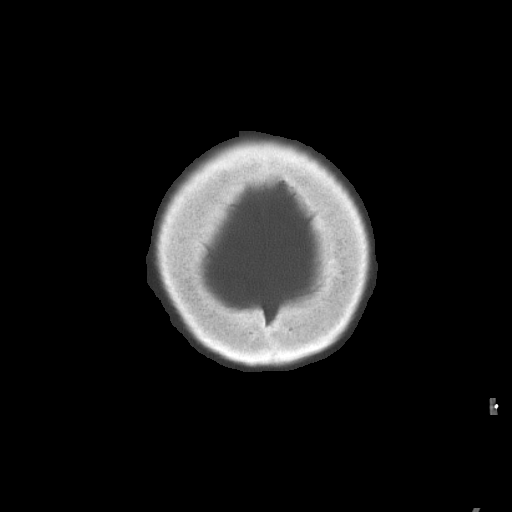

[18 of 30 positions shown; findings below may reference images not displayed]

FINDINGS: CT HEAD FINDINGS

Ventricles and sulci are prominent compatible with atrophy.
Periventricular and subcortical white matter hypodensity compatible
with chronic microvascular ischemic changes. No evidence for acute
cortically based infarct, intracranial hemorrhage, mass lesion or
mass-effect. Orbits are unremarkable. There is soft tissue swelling
and hematoma formation overlying the left frontal calvarium and
involving the left periorbital soft tissues. Old left zygomatic arch
fracture. Paranasal sinuses are well aerated. Motion artifact limits
evaluation of the skullbase.

CT CERVICAL SPINE FINDINGS

Straightening of the normal cervical lordosis. Multilevel
degenerative disc disease most pronounced C5-6 and C6-7. Multilevel
facet degenerative changes. Craniocervical junction is intact.
Prevertebral soft tissues are unremarkable. Lung apices are
unremarkable.
IMPRESSION: Soft tissue swelling overlying the left frontal calvarium and within
the periorbital soft tissues.

No acute intracranial process.

No acute cervical spine fracture.

Chronic microvascular ischemic changes.

Multilevel degenerative disc disease.
